# Patient Record
Sex: Male | Born: 1965 | Race: White | Hispanic: No | Marital: Married | Smoking: Current every day smoker
Health system: Southern US, Community
[De-identification: ages and names within clinical notes are randomized; demographics above are authoritative.]

## PROBLEM LIST (undated history)

## (undated) DIAGNOSIS — IMO0001 Reserved for inherently not codable concepts without codable children: Secondary | ICD-10-CM

## (undated) DIAGNOSIS — F1020 Alcohol dependence, uncomplicated: Secondary | ICD-10-CM

## (undated) DIAGNOSIS — F419 Anxiety disorder, unspecified: Secondary | ICD-10-CM

## (undated) HISTORY — PX: BACK SURGERY: SHX140

---

## 2000-08-25 ENCOUNTER — Encounter: Payer: Self-pay | Admitting: Neurosurgery

## 2000-08-25 ENCOUNTER — Ambulatory Visit (HOSPITAL_COMMUNITY): Admission: RE | Admit: 2000-08-25 | Discharge: 2000-08-25 | Payer: Self-pay | Admitting: Neurosurgery

## 2000-09-13 ENCOUNTER — Ambulatory Visit (HOSPITAL_COMMUNITY): Admission: RE | Admit: 2000-09-13 | Discharge: 2000-09-13 | Payer: Self-pay | Admitting: Neurosurgery

## 2000-09-14 ENCOUNTER — Encounter: Payer: Self-pay | Admitting: Neurosurgery

## 2000-10-09 ENCOUNTER — Encounter: Payer: Self-pay | Admitting: Neurosurgery

## 2000-10-09 ENCOUNTER — Ambulatory Visit (HOSPITAL_COMMUNITY): Admission: RE | Admit: 2000-10-09 | Discharge: 2000-10-10 | Payer: Self-pay | Admitting: Neurosurgery

## 2000-12-10 ENCOUNTER — Encounter: Payer: Self-pay | Admitting: Neurosurgery

## 2000-12-10 ENCOUNTER — Ambulatory Visit (HOSPITAL_COMMUNITY): Admission: RE | Admit: 2000-12-10 | Discharge: 2000-12-10 | Payer: Self-pay | Admitting: Neurosurgery

## 2006-04-17 ENCOUNTER — Emergency Department: Payer: Self-pay | Admitting: Emergency Medicine

## 2007-01-02 ENCOUNTER — Ambulatory Visit: Payer: Self-pay | Admitting: Neurosurgery

## 2007-01-21 ENCOUNTER — Ambulatory Visit: Payer: Self-pay | Admitting: Family Medicine

## 2007-03-14 ENCOUNTER — Ambulatory Visit: Payer: Self-pay | Admitting: Pain Medicine

## 2007-03-20 ENCOUNTER — Ambulatory Visit: Payer: Self-pay | Admitting: Pain Medicine

## 2007-04-18 ENCOUNTER — Ambulatory Visit: Payer: Self-pay | Admitting: Pain Medicine

## 2007-05-20 ENCOUNTER — Ambulatory Visit: Payer: Self-pay | Admitting: Pain Medicine

## 2007-07-25 ENCOUNTER — Ambulatory Visit: Payer: Self-pay | Admitting: Family Medicine

## 2018-05-21 ENCOUNTER — Other Ambulatory Visit
Admission: RE | Admit: 2018-05-21 | Discharge: 2018-05-21 | Disposition: A | Discharge status: Home / Self Care | Payer: Medicaid Other | Source: Other Acute Inpatient Hospital | Attending: Neurology | Admitting: Neurology

## 2018-05-21 DIAGNOSIS — R413 Other amnesia: Secondary | ICD-10-CM | POA: Insufficient documentation

## 2018-05-21 LAB — AMMONIA: Ammonia: 32 umol/L (ref 9–35)

## 2018-05-27 ENCOUNTER — Other Ambulatory Visit: Payer: Self-pay | Admitting: Neurology

## 2018-05-27 DIAGNOSIS — R413 Other amnesia: Secondary | ICD-10-CM

## 2018-06-08 ENCOUNTER — Ambulatory Visit: Payer: Medicaid Other

## 2018-06-12 ENCOUNTER — Ambulatory Visit
Admission: RE | Admit: 2018-06-12 | Discharge: 2018-06-12 | Disposition: A | Discharge status: Home / Self Care | Payer: Medicaid Other | Source: Ambulatory Visit | Attending: Neurology | Admitting: Neurology

## 2018-06-12 DIAGNOSIS — R410 Disorientation, unspecified: Secondary | ICD-10-CM | POA: Insufficient documentation

## 2018-06-12 DIAGNOSIS — G319 Degenerative disease of nervous system, unspecified: Secondary | ICD-10-CM | POA: Insufficient documentation

## 2018-06-12 DIAGNOSIS — R413 Other amnesia: Secondary | ICD-10-CM | POA: Diagnosis not present

## 2018-06-12 DIAGNOSIS — J323 Chronic sphenoidal sinusitis: Secondary | ICD-10-CM | POA: Diagnosis not present

## 2018-10-05 ENCOUNTER — Other Ambulatory Visit: Payer: Self-pay

## 2018-10-05 ENCOUNTER — Emergency Department: Payer: Medicaid Other

## 2018-10-05 ENCOUNTER — Inpatient Hospital Stay
Admission: EM | Admit: 2018-10-05 | Discharge: 2018-10-09 | DRG: 896 | Disposition: A | Discharge status: Home / Self Care | Payer: Medicaid Other | Attending: Internal Medicine | Admitting: Internal Medicine

## 2018-10-05 DIAGNOSIS — R269 Unspecified abnormalities of gait and mobility: Secondary | ICD-10-CM | POA: Diagnosis present

## 2018-10-05 DIAGNOSIS — G9341 Metabolic encephalopathy: Secondary | ICD-10-CM | POA: Diagnosis present

## 2018-10-05 DIAGNOSIS — F13239 Sedative, hypnotic or anxiolytic dependence with withdrawal, unspecified: Secondary | ICD-10-CM | POA: Diagnosis present

## 2018-10-05 DIAGNOSIS — F1027 Alcohol dependence with alcohol-induced persisting dementia: Principal | ICD-10-CM | POA: Diagnosis present

## 2018-10-05 DIAGNOSIS — R296 Repeated falls: Secondary | ICD-10-CM | POA: Diagnosis present

## 2018-10-05 DIAGNOSIS — S7001XA Contusion of right hip, initial encounter: Secondary | ICD-10-CM | POA: Diagnosis present

## 2018-10-05 DIAGNOSIS — E860 Dehydration: Secondary | ICD-10-CM | POA: Diagnosis present

## 2018-10-05 DIAGNOSIS — E876 Hypokalemia: Secondary | ICD-10-CM | POA: Diagnosis present

## 2018-10-05 DIAGNOSIS — M6282 Rhabdomyolysis: Secondary | ICD-10-CM | POA: Diagnosis present

## 2018-10-05 DIAGNOSIS — R410 Disorientation, unspecified: Secondary | ICD-10-CM

## 2018-10-05 DIAGNOSIS — F10239 Alcohol dependence with withdrawal, unspecified: Secondary | ICD-10-CM | POA: Diagnosis present

## 2018-10-05 DIAGNOSIS — W19XXXA Unspecified fall, initial encounter: Secondary | ICD-10-CM | POA: Diagnosis present

## 2018-10-05 DIAGNOSIS — R4689 Other symptoms and signs involving appearance and behavior: Secondary | ICD-10-CM | POA: Diagnosis present

## 2018-10-05 LAB — ETHANOL

## 2018-10-05 LAB — LACTIC ACID, PLASMA
Lactic Acid, Venous: 2.1 mmol/L (ref 0.5–1.9)
Lactic Acid, Venous: 1.8 mmol/L (ref 0.5–1.9)

## 2018-10-05 LAB — URINALYSIS, COMPLETE (UACMP) WITH MICROSCOPIC
GLUCOSE, UA: NEGATIVE mg/dL
HGB URINE DIPSTICK: NEGATIVE
Ketones, ur: 80 mg/dL — AB
LEUKOCYTES UA: NEGATIVE
NITRITE: NEGATIVE
PROTEIN: 30 mg/dL — AB
Specific Gravity, Urine: 1.026 (ref 1.005–1.030)
pH: 6 (ref 5.0–8.0)

## 2018-10-05 LAB — CK: Total CK: 1631 U/L — ABNORMAL HIGH (ref 49–397)

## 2018-10-05 LAB — URINE DRUG SCREEN, QUALITATIVE (ARMC ONLY)
Amphetamines, Ur Screen: NOT DETECTED
Barbiturates, Ur Screen: NOT DETECTED
Benzodiazepine, Ur Scrn: POSITIVE — AB
COCAINE METABOLITE, UR ~~LOC~~: NOT DETECTED
Cannabinoid 50 Ng, Ur ~~LOC~~: NOT DETECTED
MDMA (Ecstasy)Ur Screen: NOT DETECTED
METHADONE SCREEN, URINE: NOT DETECTED
OPIATE, UR SCREEN: NOT DETECTED
PHENCYCLIDINE (PCP) UR S: NOT DETECTED
Tricyclic, Ur Screen: POSITIVE — AB

## 2018-10-05 LAB — ACETAMINOPHEN LEVEL: Acetaminophen (Tylenol), Serum: <10 ug/mL — ABNORMAL LOW (ref 10–30)

## 2018-10-05 LAB — CBC WITH DIFFERENTIAL/PLATELET
Abs Immature Granulocytes: 0.03 10*3/uL (ref 0.00–0.07)
BASOS PCT: 0 %
Basophils Absolute: 0 10*3/uL (ref 0.0–0.1)
EOS ABS: 0 10*3/uL (ref 0.0–0.5)
EOS PCT: 0 %
HEMATOCRIT: 40.7 % (ref 39.0–52.0)
Hemoglobin: 13.9 g/dL (ref 13.0–17.0)
Immature Granulocytes: 0 %
Lymphocytes Relative: 14 %
Lymphs Abs: 1.1 10*3/uL (ref 0.7–4.0)
MCH: 38.7 pg — ABNORMAL HIGH (ref 26.0–34.0)
MCHC: 34.2 g/dL (ref 30.0–36.0)
MCV: 113.4 fL — AB (ref 80.0–100.0)
MONO ABS: 0.9 10*3/uL (ref 0.1–1.0)
Monocytes Relative: 12 %
NRBC: 0 % (ref 0.0–0.2)
Neutro Abs: 5.7 10*3/uL (ref 1.7–7.7)
Neutrophils Relative %: 74 %
PLATELETS: 162 10*3/uL (ref 150–400)
RBC: 3.59 MIL/uL — AB (ref 4.22–5.81)
RDW: 12.5 % (ref 11.5–15.5)
WBC: 7.8 10*3/uL (ref 4.0–10.5)

## 2018-10-05 LAB — SALICYLATE LEVEL

## 2018-10-05 LAB — COMPREHENSIVE METABOLIC PANEL
ALT: 34 U/L (ref 0–44)
ANION GAP: 17 — AB (ref 5–15)
AST: 111 U/L — ABNORMAL HIGH (ref 15–41)
Albumin: 3.1 g/dL — ABNORMAL LOW (ref 3.5–5.0)
Alkaline Phosphatase: 37 U/L — ABNORMAL LOW (ref 38–126)
BILIRUBIN TOTAL: 4.1 mg/dL — AB (ref 0.3–1.2)
BUN: 8 mg/dL (ref 6–20)
CO2: 25 mmol/L (ref 22–32)
Calcium: 9 mg/dL (ref 8.9–10.3)
Chloride: 95 mmol/L — ABNORMAL LOW (ref 98–111)
Creatinine, Ser: 0.53 mg/dL — ABNORMAL LOW (ref 0.61–1.24)
GFR calc Af Amer: >60 mL/min (ref >60)
Glucose, Bld: 109 mg/dL — ABNORMAL HIGH (ref 70–99)
POTASSIUM: 3.8 mmol/L (ref 3.5–5.1)
Sodium: 137 mmol/L (ref 135–145)
TOTAL PROTEIN: 7.5 g/dL (ref 6.5–8.1)

## 2018-10-05 LAB — TROPONIN I

## 2018-10-05 LAB — AMMONIA: AMMONIA: 23 umol/L (ref 9–35)

## 2018-10-05 LAB — PROTIME-INR
INR: 1.39
Prothrombin Time: 16.9 seconds — ABNORMAL HIGH (ref 11.4–15.2)

## 2018-10-05 LAB — GLUCOSE, CAPILLARY: Glucose-Capillary: 107 mg/dL — ABNORMAL HIGH (ref 70–99)

## 2018-10-05 LAB — TSH: TSH: 1.491 u[IU]/mL (ref 0.350–4.500)

## 2018-10-05 LAB — LIPASE, BLOOD: Lipase: 27 U/L (ref 11–51)

## 2018-10-05 LAB — MAGNESIUM: Magnesium: 1.7 mg/dL (ref 1.7–2.4)

## 2018-10-05 MED ORDER — THIAMINE HCL 100 MG/ML IJ SOLN
100.0000 mg | Freq: Once | INTRAMUSCULAR | Status: AC
  Administered 2018-10-05: 100 mg via INTRAVENOUS
  Filled 2018-10-05: qty 2

## 2018-10-05 MED ORDER — NALOXONE NEWBORN-WH INJECTION 0.4 MG/ML
0.4000 mg | Freq: Once | INTRAMUSCULAR | Status: DC
  Filled 2018-10-05: qty 1

## 2018-10-05 MED ORDER — LORAZEPAM 1 MG PO TABS
1.0000 mg | ORAL_TABLET | Freq: Four times a day (QID) | ORAL | Status: AC | PRN
  Administered 2018-10-07: 14:00:00 1 mg via ORAL
  Filled 2018-10-05: qty 1

## 2018-10-05 MED ORDER — SODIUM CHLORIDE 0.9 % IV BOLUS
500.0000 mL | Freq: Once | INTRAVENOUS | Status: AC
  Administered 2018-10-05: 12:00:00 via INTRAVENOUS

## 2018-10-05 MED ORDER — PNEUMOCOCCAL VAC POLYVALENT 25 MCG/0.5ML IJ INJ: 0.5000 mL | INJECTION | INTRAMUSCULAR | Status: DC

## 2018-10-05 MED ORDER — ACETAMINOPHEN 325 MG PO TABS
650.0000 mg | ORAL_TABLET | Freq: Four times a day (QID) | ORAL | Status: DC | PRN
  Administered 2018-10-08: 13:00:00 650 mg via ORAL
  Filled 2018-10-05: qty 2

## 2018-10-05 MED ORDER — ORAL CARE MOUTH RINSE
15.0000 mL | Freq: Two times a day (BID) | OROMUCOSAL | Status: DC
  Administered 2018-10-06 – 2018-10-08 (×4): 15 mL via OROMUCOSAL

## 2018-10-05 MED ORDER — ONDANSETRON HCL 4 MG PO TABS: 4.0000 mg | ORAL_TABLET | Freq: Four times a day (QID) | ORAL | Status: DC | PRN

## 2018-10-05 MED ORDER — ONDANSETRON HCL 4 MG/2ML IJ SOLN: 4.0000 mg | Freq: Four times a day (QID) | INTRAMUSCULAR | Status: DC | PRN

## 2018-10-05 MED ORDER — POLYETHYLENE GLYCOL 3350 17 G PO PACK: 17.0000 g | PACK | Freq: Every day | ORAL | Status: DC | PRN

## 2018-10-05 MED ORDER — FOLIC ACID 1 MG PO TABS
1.0000 mg | ORAL_TABLET | Freq: Every day | ORAL | Status: DC
  Administered 2018-10-05 – 2018-10-09 (×5): 1 mg via ORAL
  Filled 2018-10-05 (×5): qty 1

## 2018-10-05 MED ORDER — SODIUM CHLORIDE 0.9 % IV SOLN
INTRAVENOUS | Status: DC
  Administered 2018-10-05 – 2018-10-07 (×6): via INTRAVENOUS

## 2018-10-05 MED ORDER — LACTULOSE 10 GM/15ML PO SOLN
20.0000 g | Freq: Two times a day (BID) | ORAL | Status: DC
  Administered 2018-10-05 – 2018-10-07 (×5): 20 g via ORAL
  Filled 2018-10-05 (×5): qty 30

## 2018-10-05 MED ORDER — THIAMINE HCL 100 MG/ML IJ SOLN
100.0000 mg | Freq: Every day | INTRAMUSCULAR | Status: DC
  Filled 2018-10-05 (×5): qty 1

## 2018-10-05 MED ORDER — ACETAMINOPHEN 650 MG RE SUPP: 650.0000 mg | Freq: Four times a day (QID) | RECTAL | Status: DC | PRN

## 2018-10-05 MED ORDER — ALPRAZOLAM 1 MG PO TABS
1.0000 mg | ORAL_TABLET | Freq: Three times a day (TID) | ORAL | Status: DC
  Administered 2018-10-05 – 2018-10-07 (×6): 1 mg via ORAL
  Filled 2018-10-05 (×6): qty 1

## 2018-10-05 MED ORDER — ENOXAPARIN SODIUM 40 MG/0.4ML ~~LOC~~ SOLN
40.0000 mg | SUBCUTANEOUS | Status: DC
  Administered 2018-10-05: 14:00:00 40 mg via SUBCUTANEOUS
  Filled 2018-10-05: qty 0.4

## 2018-10-05 MED ORDER — SODIUM CHLORIDE 0.9 % IV SOLN
1.0000 mg | Freq: Once | INTRAVENOUS | Status: AC
  Administered 2018-10-05: 1 mg via INTRAVENOUS
  Filled 2018-10-05: qty 0.2

## 2018-10-05 MED ORDER — LORAZEPAM 2 MG/ML IJ SOLN
1.0000 mg | Freq: Four times a day (QID) | INTRAMUSCULAR | Status: AC | PRN
  Administered 2018-10-05 – 2018-10-08 (×4): 1 mg via INTRAVENOUS
  Filled 2018-10-05 (×4): qty 1

## 2018-10-05 MED ORDER — LORAZEPAM 2 MG/ML IJ SOLN
2.0000 mg | Freq: Once | INTRAMUSCULAR | Status: AC
  Administered 2018-10-05: 1 mg via INTRAVENOUS
  Filled 2018-10-05: qty 1

## 2018-10-05 MED ORDER — INFLUENZA VAC SPLIT QUAD 0.5 ML IM SUSY: 0.5000 mL | PREFILLED_SYRINGE | INTRAMUSCULAR | Status: DC

## 2018-10-05 MED ORDER — ADULT MULTIVITAMIN W/MINERALS CH
1.0000 | ORAL_TABLET | Freq: Every day | ORAL | Status: DC
  Administered 2018-10-05 – 2018-10-09 (×5): 1 via ORAL
  Filled 2018-10-05 (×5): qty 1

## 2018-10-05 MED ORDER — VITAMIN B-1 100 MG PO TABS
100.0000 mg | ORAL_TABLET | Freq: Every day | ORAL | Status: DC
  Administered 2018-10-05 – 2018-10-09 (×5): 100 mg via ORAL
  Filled 2018-10-05 (×5): qty 1

## 2018-10-05 NOTE — ED Triage Notes (Signed)
Pt presents from home via acems with c/o frequent falls and altered mental status. EMS was called to pt residence yesterday night with c/o fall, but refused tx. EMS was called back to residence by pt's family with c/o continued falls and confusion. Pt oriented to self, but disoriented to time. Pt presents to ER covered in urine and fecal matter. Varying stages of bruising noted to body.

## 2018-10-05 NOTE — ED Notes (Signed)
Date and time results received: 10/05/18 12:19 PM    Test: Lactic Acid Critical Value: 2.1  Name of Provider Notified: Dr. Alphonzo LemmingsMcShane  Orders Received? Or Actions Taken?: Orders Received - See Orders for details

## 2018-10-05 NOTE — ED Notes (Signed)
Pt family member requested to this RN that pt's clothes be thrown away.

## 2018-10-05 NOTE — H&P (Signed)
[facility] [place] [facility]   PATIENT NAME: Phillip Juarez    MR#:  [id number]  DATE OF BIRTH:  12-06-1965  DATE OF ADMISSION:  10/05/2018  PRIMARY CARE PHYSICIAN: [facility]   REQUESTING/REFERRING PHYSICIAN:dr mcshane  CHIEF COMPLAINT:   confusion and falls HISTORY OF PRESENT ILLNESS:  Phillip Juarez  is a 52 y.o. male with a known history of EtOh dependence who presents to the ER via EMS due to falls and increased confusion. He is being followed by Dr. 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  Family was in the room and now has left.  HPI taken from nurse and ER physician.  Apparently the family members went to check on him yesterday and he was slightly confused and they were concerned so they called EMS.  At that time patient was more awake and alert and refused EMS transport.  Today when they checked on him he was very confused and disheveled and was brought to the ER for further evaluation.  In the emergency room he remains in a confused state.  Family members are also concerned due to recurrent falls.  Patient has bruising on the right hip and arm.  He is able to move all extremities.  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 rate 18, height 6' (1.829 m), weight 77.1 kg, SpO2 97 %.  PHYSICAL EXAMINATION:   Physical Exam  Constitutional: No distress.  dissheveled  HENT:  Head: Normocephalic.  Eyes: Pupils are equal, round, and reactive to light. Right eye exhibits no  discharge. Left eye exhibits no discharge. No scleral icterus.  Right eye is shut  Neck: Neck supple. No JVD present. No tracheal deviation present. No thyromegaly present.  Cardiovascular: Normal rate, regular rhythm and normal heart sounds. Exam reveals no gallop and no friction rub.  No murmur heard. Pulmonary/Chest: Effort normal and breath sounds normal. No respiratory distress. He has no wheezes. He has no rales. He exhibits no tenderness.  Abdominal: Soft. Bowel sounds are normal. He exhibits no distension and no mass. There is no tenderness. There is no rebound and no guarding.  Musculoskeletal: Normal range of motion. He exhibits no edema.  Neurological: He displays normal reflexes. No cranial nerve deficit or sensory deficit. He exhibits normal muscle tone. Coordination normal.  Somnolent and confused Without focal deficits  Skin: Skin is warm. No rash noted. No erythema.   Bruising noted on right hip/buttock and right arm  Psychiatric: Judgment normal.      LABORATORY PANEL:   CBC Recent Labs  Lab 10/05/18 1113  WBC 7.8  HGB 13.9  HCT 40.7  PLT 162   ------------------------------------------------------------------------------------------------------------------  Chemistries  Recent Labs  Lab 10/05/18 1113  NA 137  K 3.8  CL 95*  CO2 25  GLUCOSE 109*  BUN 8  CREATININE 0.53*  CALCIUM 9.0  MG 1.7  AST 111*  ALT 34  ALKPHOS 37*  BILITOT 4.1*   ------------------------------------------------------------------------------------------------------------------  Cardiac Enzymes Recent Labs  Lab 10/05/18 1113  TROPONINI <0.03   ------------------------------------------------------------------------------------------------------------------  RADIOLOGY:  Ct Head Wo Contrast  Result Date: 10/05/2018 CLINICAL DATA:  Frequent falls, altered mental status, disorientation, confusion, bruising at scattered sites EXAM: CT HEAD WITHOUT CONTRAST TECHNIQUE:  Contiguous axial images were obtained from the base of the skull through the vertex without intravenous contrast. Sagittal and coronal MPR images reconstructed from axial data set. COMPARISON:  None; correlation MR brain 06/12/2018 FINDINGS: Brain: Mild generalized atrophy. Normal ventricular morphology. No midline shift or mass effect. Otherwise normal appearance of brain parenchyma. No intracranial hemorrhage, mass lesion, or acute infarction. No extra-axial fluid collections. Vascular: No hyperdense vessels Skull: Intact with note of a stable small lucent focus at the posterior RIGHT parietal bone Sinuses/Orbits: Minimal mucosal thickening in ethmoid air cells. Remaining sinuses clear. Other: N/A IMPRESSION: Mild generalized atrophy. No acute intracranial abnormalities. Electronically Signed   By: Ulyses SouthwardMark  Boles M.D.   On: 10/05/2018 11:39   Dg Chest Port 1 View  Result Date: 10/05/2018 CLINICAL DATA:  Confusion, altered mental status, frequent falls EXAM: PORTABLE CHEST 1 VIEW COMPARISON:  Portable exam 1115 hours compared to CT chest of 07/25/2007 FINDINGS: Normal heart size, mediastinal contours, and pulmonary vascularity. Lungs clear. No pleural effusion or pneumothorax. Bones demineralized. IMPRESSION: No acute abnormalities. Electronically Signed   By: Ulyses SouthwardMark  Boles M.D.   On: 10/05/2018 11:41    EKG:  Sinus rhythm heart rate 80 no ST elevation or depression  IMPRESSION AND PLAN:   52 y/o male on XANAZ 2 mg QID and etoh dependence with etoh related dementia who presents to the ED due to increased confuion.  1. Acute encephalopathy with concern for impending DT's or xanax abuse with underlying alcohol-related dementia  Check TSH, B1, B12, RPR and Nh3 level and urine tox screen  Check EEG r/o seizure  Neuro checks q 4 h for at least 24 hours No signs of acute infection on labs CT head negative for acute issues and patient not demenostrating focal defecits. One time dose narcan ordered and  lactulose until ammonia level back  2. EtOh abuse with possible DTs: CIWA protocol    All the records are reviewed and case discussed with ED provider.  CODE STATUS: FULL  TOTAL TIME TAKING CARE OF THIS PATIENT: 44 minutes.    Phillip Juarez M.D on 10/05/2018 at 12:13 PM  Between 7am to 6pm - Pager - [phone]  After 6pm go to [email or web address] - Social research officer, governmentpassword [email or web address]  [facility]  Office  [phone]  CC: Primary care physician; [facility]

## 2018-10-05 NOTE — ED Notes (Signed)
Pt noted to be mumbling incoherently in bed. Pt family at bedside at this time.

## 2018-10-05 NOTE — ED Provider Notes (Signed)
[facility] Emergency Department Provider Note  ____________________________________________   I have reviewed the triage vital signs and the nursing notes. Where available I have reviewed prior notes and, if possible and indicated, outside hospital notes.    HISTORY  Chief Complaint Fall and Altered Mental Status    HPI Phillip Juarez is a 52 y.o. male  Who presents today complaining of feeling confused.  According to family, he drinks alcohol significantly every day, they do not know when the last time he drank alcohol was, also they state that he takes Xanax 3 times a day has been for years.  They are not sure if he is taking it.  They found him yesterday after not seeing him since Wednesday, in a state of dishevelment and confusion and they called 911 however at that time he was oriented and able to refuse transport.  Family were concerned and they came back today to check on him and found him even more confused and brought him in.  Family member with him is power of attorney.  Patient himself is awake and will answer basic questions but will not give further history.  Level 5 chart caveat; no further history available due to patient status.    No past medical history on file.  There are no active problems to display for this patient.     Prior to Admission medications   Not on File    Allergies Patient has no known allergies.  No family history on file.  Social History Social History   Tobacco Use  . Smoking status: Not on file  Substance Use Topics  . Alcohol use: Not on file  . Drug use: Not on file    Review of Systems Level 5 chart caveat; no further history available due to patient status.   ____________________________________________   PHYSICAL EXAM:  VITAL SIGNS: ED Triage Vitals  Enc Vitals Group     BP 10/05/18 1127 128/86     Pulse Rate 10/05/18 1127 95     Resp 10/05/18 1127 (!) 22     Temp 10/05/18 1127 (!) 97.5  F (36.4 C)     Temp Source 10/05/18 1127 Axillary     SpO2 10/05/18 1127 100 %     Weight 10/05/18 1108 170 lb (77.1 kg)     Height 10/05/18 1108 6' (1.829 m)     Head Circumference --      Peak Flow --      Pain Score --      Pain Loc --      Pain Edu? --      Excl. in GC? --     Constitutional: he is alert and confused, very early kempt with stool on his leg, guarding his airway will tell me his name, will answer some simple questions Eyes: Conjunctivae are normal Head: Atraumatic no obvious injury noted HEENT: No congestion/rhinnorhea. Mucous membranes are dry.  Oropharynx non-erythematous Neck:   Nontender with no meningismus, no masses, no stridor Cardiovascular: Patient with mild tachycardia 108. Grossly normal heart sounds.  Good peripheral circulation. Respiratory: Normal respiratory effort.  No retractions. Lungs CTAB. Abdominal: Soft and nontender. No distention. No guarding no rebound Back:  There is no focal tenderness or step off.  there is no midline tenderness there are no lesions noted. there is no CVA tenderness GU: No obvious lesions Musculoskeletal: No lower extremity tenderness, no upper extremity tenderness. No joint effusions, no DVT signs strong distal pulses no edema  Neurologic:  Normal speech and language. No gross focal neurologic deficits are appreciated.  Skin:  Skin is warm, dry and intact.  Good bruising noted to the right forearm, good distal pulses are soft Psychiatric: Mood and affect are normal. Speech and behavior are normal.  ____________________________________________   LABS (all labs ordered are listed, but only abnormal results are displayed)  Labs Reviewed  CBC WITH DIFFERENTIAL/PLATELET - Abnormal; Notable for the following components:      Result Value   RBC 3.59 (*)    MCV 113.4 (*)    MCH 38.7 (*)    All other components within normal limits  COMPREHENSIVE METABOLIC PANEL - Abnormal; Notable for the following components:    Chloride 95 (*)    Glucose, Bld 109 (*)    Creatinine, Ser 0.53 (*)    Albumin 3.1 (*)    AST 111 (*)    Alkaline Phosphatase 37 (*)    Total Bilirubin 4.1 (*)    Anion gap 17 (*)    All other components within normal limits  ACETAMINOPHEN LEVEL - Abnormal; Notable for the following components:   Acetaminophen (Tylenol), Serum <10 (*)    All other components within normal limits  LACTIC ACID, PLASMA - Abnormal; Notable for the following components:   Lactic Acid, Venous 2.1 (*)    All other components within normal limits  URINALYSIS, COMPLETE (UACMP) WITH MICROSCOPIC - Abnormal; Notable for the following components:   Color, Urine AMBER (*)    APPearance CLEAR (*)    Bilirubin Urine SMALL (*)    Ketones, ur 80 (*)    Protein, ur 30 (*)    Bacteria, UA RARE (*)    All other components within normal limits  GLUCOSE, CAPILLARY - Abnormal; Notable for the following components:   Glucose-Capillary 107 (*)    All other components within normal limits  URINE CULTURE  ETHANOL  SALICYLATE LEVEL  TROPONIN I  MAGNESIUM  LIPASE, BLOOD  LACTIC ACID, PLASMA  URINE DRUG SCREEN, QUALITATIVE ([facility] ONLY)  PROTIME-INR  AMMONIA  CK    Pertinent labs  results that were available during my care of the patient were reviewed by me and considered in my medical decision making (see chart for details). ____________________________________________  EKG  I personally interpreted any EKGs ordered by me or triage Sinus rhythm rate 80 bpm no acute ST elevation or depression normal axis unremarkable EKG ____________________________________________  RADIOLOGY  Pertinent labs & imaging results that were available during my care of the patient were reviewed by me and considered in my medical decision making (see chart for details). If possible, patient and/or family made aware of any abnormal findings.  Ct Head Wo Contrast  Result Date: 10/05/2018 CLINICAL DATA:  Frequent falls, altered mental  status, disorientation, confusion, bruising at scattered sites EXAM: CT HEAD WITHOUT CONTRAST TECHNIQUE: Contiguous axial images were obtained from the base of the skull through the vertex without intravenous contrast. Sagittal and coronal MPR images reconstructed from axial data set. COMPARISON:  None; correlation MR brain 06/12/2018 FINDINGS: Brain: Mild generalized atrophy. Normal ventricular morphology. No midline shift or mass effect. Otherwise normal appearance of brain parenchyma. No intracranial hemorrhage, mass lesion, or acute infarction. No extra-axial fluid collections. Vascular: No hyperdense vessels Skull: Intact with note of a stable small lucent focus at the posterior RIGHT parietal bone Sinuses/Orbits: Minimal mucosal thickening in ethmoid air cells. Remaining sinuses clear. Other: N/A IMPRESSION: Mild generalized atrophy. No acute intracranial abnormalities. Electronically Signed   By:  Ulyses Southward M.D.   On: 10/05/2018 11:39   Dg Chest Port 1 View  Result Date: 10/05/2018 CLINICAL DATA:  Confusion, altered mental status, frequent falls EXAM: PORTABLE CHEST 1 VIEW COMPARISON:  Portable exam 1115 hours compared to CT chest of 07/25/2007 FINDINGS: Normal heart size, mediastinal contours, and pulmonary vascularity. Lungs clear. No pleural effusion or pneumothorax. Bones demineralized. IMPRESSION: No acute abnormalities. Electronically Signed   By: Ulyses Southward M.D.   On: 10/05/2018 11:41   ____________________________________________    PROCEDURES  Procedure(s) performed: None  Procedures  Critical Care performed: CRITICAL CARE Performed by: Jeanmarie Plant   Total critical care time: 38 minutes  Critical care time was exclusive of separately billable procedures and treating other patients.  Critical care was necessary to treat or prevent imminent or life-threatening deterioration.  Critical care was time spent personally by me on the following activities: development of  treatment plan with patient and/or surrogate as well as nursing, discussions with consultants, evaluation of patient's response to treatment, examination of patient, obtaining history from patient or surrogate, ordering and performing treatments and interventions, ordering and review of laboratory studies, ordering and review of radiographic studies, pulse oximetry and re-evaluation of patient's condition.   ____________________________________________   INITIAL IMPRESSION / ASSESSMENT AND PLAN / ED COURSE  Pertinent labs & imaging results that were available during my care of the patient were reviewed by me and considered in my medical decision making (see chart for details).  Here with confusion and evidence of falls at home.  Unclear exact etiology.  Blood work shows an elevated bilirubin which certainly could be because of alcohol.  He does not have evidence on exam of obstructive pathology physically that is to say he is nontender in his abdomen to the extent that we can determine.  Ammonia level is pending, EtOH is negative, patient does drink every day, also takes significant  benzos.  The family thinks that he is "out of them".  This picture certainly could be the result of DTs or withdrawal.  Is unclear when the last time he had alcohol was.  EtOH level is negative.  He is a daily drinker.  We are giving him Ativan to forestall seizures, IV fluids, magnesium is reassuring we are giving him thiamine and folate, blood work is noted.  CT head is negative, will x-ray his forearm but there is no evidence at this time of fracture.  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  We will admit to the hospitalist service.  Did discuss with Dr. Tildon Husky who agrees with plan    ____________________________________________   FINAL CLINICAL IMPRESSION(S) / ED DIAGNOSES  Final diagnoses:  Confusion       This chart was dictated using voice recognition software.  Despite best efforts to proofread,  errors can occur which can change meaning.      Jeanmarie Plant, MD 10/05/18 1215

## 2018-10-06 LAB — VITAMIN B12: Vitamin B-12: 465 pg/mL (ref 180–914)

## 2018-10-06 LAB — URINE CULTURE: Culture: NO GROWTH

## 2018-10-06 NOTE — Progress Notes (Signed)
Advanced care plan.  Purpose of the Encounter: CODE STATUS  Parties in Attendance: Patient and family  Patient's Decision Capacity: Not good  Subjective/Patient's story: Presented to the emergency room for confusion and frequent falls   Objective/Medical story Patient has  alcohol abuse and also has alcoholic dementia Has frequent falls and encephalopathic Patient's sister was available at bedside Needs IV fluids Management of multiple bruises secondary to fall   Goals of care determination:  Advance care directives and goals of care discussed with patient's family in detail For now they want everything done which includes CPR, intubation ventilator if the need arises   CODE STATUS: Full code   Time spent discussing advanced care planning: 16 minutes

## 2018-10-06 NOTE — Plan of Care (Signed)

## 2018-10-06 NOTE — Progress Notes (Signed)
Pt VS stable. Alert and oriented to self at this time.  Currently resting without agitation. Pt states his last drink was "last night" 10/04/2018. Pt states he has been drinking for over 10 years and requests alcohol at this time but is easily reoriented and distracted. Will continue to monitor.

## 2018-10-06 NOTE — Progress Notes (Addendum)
[facility] - [place] at [facility]   PATIENT NAME: Phillip Juarez    MR#:  [id number]  DATE OF BIRTH:  08/27/1966  SUBJECTIVE:  CHIEF COMPLAINT:   Chief Complaint  Patient presents with  . Fall  . Altered Mental Status  Patient seen and evaluated today Unable to give any history Lethargic and sleepy Patient was worked up with CT head which showed no acute abnormality Imaging studies reveal no fracture REVIEW OF SYSTEMS:    ROS  Unable to obtain secondary to confusion  DRUG ALLERGIES:  No Known Allergies  VITALS:  Blood pressure 108/70, pulse 87, temperature 98.2 F (36.8 C), temperature source Oral, resp. rate 16, height 5\' 7"  (1.702 m), weight 79.8 kg, SpO2 96 %.  PHYSICAL EXAMINATION:   Physical Exam  GENERAL:  52 y.o.-year-old patient lying in the bed  EYES: Pupils equal, round, reactive to light and accommodation. No scleral icterus. Extraocular muscles intact.  HEENT: Head atraumatic, normocephalic. Oropharynx dry and nasopharynx clear.  NECK:  Supple, no jugular venous distention. No thyroid enlargement, no tenderness.  LUNGS: Normal breath sounds bilaterally, no wheezing, rales, rhonchi. No use of accessory muscles of respiration.  CARDIOVASCULAR: S1, S2 normal. No murmurs, rubs, or gallops.  ABDOMEN: Soft, nontender, nondistended. Bowel sounds present. No organomegaly or mass.  EXTREMITIES: No clubbing or edema b/l.  Has ecchymosis over the right arm forearm and right lower extremity NEUROLOGIC: Unable to assess completely Patient confused lethargic and sleepy PSYCHIATRIC: The patient is alert and oriented x none SKIN: Multiple bruises over the right arm forearm and right thigh  LABORATORY PANEL:   CBC Recent Labs  Lab 10/05/18 1113  WBC 7.8  HGB 13.9  HCT 40.7  PLT 162   ------------------------------------------------------------------------------------------------------------------ Chemistries  Recent Labs  Lab 10/05/18 1113   NA 137  K 3.8  CL 95*  CO2 25  GLUCOSE 109*  BUN 8  CREATININE 0.53*  CALCIUM 9.0  MG 1.7  AST 111*  ALT 34  ALKPHOS 37*  BILITOT 4.1*   ------------------------------------------------------------------------------------------------------------------  Cardiac Enzymes Recent Labs  Lab 10/05/18 1113  TROPONINI <0.03   ------------------------------------------------------------------------------------------------------------------  RADIOLOGY:  Dg Forearm Right  Result Date: 10/05/2018 CLINICAL DATA:  RIGHT forearm bruising EXAM: RIGHT FOREARM - 2 VIEW COMPARISON:  None FINDINGS: Osseous mineralization normal. Wrist and elbow joint alignments normal. No acute fracture, dislocation, or bone destruction. Soft tissue swelling of RIGHT forearm and elbow. IMPRESSION: No acute osseous abnormalities. Diffuse soft tissue swelling. Electronically Signed   By: Ulyses Southward M.D.   On: 10/05/2018 12:27   Ct Head Wo Contrast  Result Date: 10/05/2018 CLINICAL DATA:  Frequent falls, altered mental status, disorientation, confusion, bruising at scattered sites EXAM: CT HEAD WITHOUT CONTRAST TECHNIQUE: Contiguous axial images were obtained from the base of the skull through the vertex without intravenous contrast. Sagittal and coronal MPR images reconstructed from axial data set. COMPARISON:  None; correlation MR brain 06/12/2018 FINDINGS: Brain: Mild generalized atrophy. Normal ventricular morphology. No midline shift or mass effect. Otherwise normal appearance of brain parenchyma. No intracranial hemorrhage, mass lesion, or acute infarction. No extra-axial fluid collections. Vascular: No hyperdense vessels Skull: Intact with note of a stable small lucent focus at the posterior RIGHT parietal bone Sinuses/Orbits: Minimal mucosal thickening in ethmoid air cells. Remaining sinuses clear. Other: N/A IMPRESSION: Mild generalized atrophy. No acute intracranial abnormalities. Electronically Signed   By:  Ulyses Southward M.D.   On: 10/05/2018 11:39   Dg Chest [facility] 1 View  Result  Date: 10/05/2018 CLINICAL DATA:  Confusion, altered mental status, frequent falls EXAM: PORTABLE CHEST 1 VIEW COMPARISON:  Portable exam 1115 hours compared to CT chest of 07/25/2007 FINDINGS: Normal heart size, mediastinal contours, and pulmonary vascularity. Lungs clear. No pleural effusion or pneumothorax. Bones demineralized. IMPRESSION: No acute abnormalities. Electronically Signed   By: Ulyses SouthwardMark  Boles M.D.   On: 10/05/2018 11:41     ASSESSMENT AND PLAN:  52 year old male patient with history of dementia, alcohol dependence and abuse currently under hospitalist service for frequent falls and confusion  -Acute metabolic encephalopathy Alcohol induced IV fluid hydration Supportive care  -Alcoholic dementia Thiamine and folic acid supplements  -Multiple ecchymosis secondary to fall Avoid blood thinner medications  -Alcohol abuse CIWA protocol  -Rhabdomyolysis Secondary to falls IV fluid hydration  -DVT prophylaxis sequential compression device to lower extremities  -Gait instability and ambulatory dysfunction Once medically stable physical therapy evaluation  All the records are reviewed and case discussed with Care Management/Social Worker. Management plans discussed with the patient, family and they are in agreement.  CODE STATUS: Full code  DVT Prophylaxis: SCDs  TOTAL TIME TAKING CARE OF THIS PATIENT: 36 minutes.   POSSIBLE D/C IN  3 to 4 DAYS, DEPENDING ON CLINICAL CONDITION.  Ihor AustinPavan Jalaila Caradonna M.D on 10/06/2018 at 10:20 AM  Between 7am to 6pm - Pager - [phone]  After 6pm go to [email or web address] - password [email or web address]  [facility]  Office  [phone]  CC: Primary care physician; Center, [facility]  Note: This dictation was prepared with Dragon dictation along with smaller phrase technology. Any transcriptional errors that result from this process are  unintentional.

## 2018-10-06 NOTE — Care Management (Signed)
RNCM team received consult for financial assistance. Patient is currently incoherent from alcohol use. Patient was brought in from ED with increasing confusion and falls. Patient currently asking for alcohol. Blood alcohol level significantly elevated. Patient has medicaid. Seen at [facility]. RNCM team will complete assessment as patient becomes more lucid.

## 2018-10-06 NOTE — Progress Notes (Signed)
Pt anxious and agitated about medication change. Dr. Imogene Burnhen notified about home Xanax prescription being 2mg  4x daily and the hospital order is 1mg  3x daily. Dr. Imogene Burnhen stated to keep the order "the same" as ordered.

## 2018-10-06 NOTE — Progress Notes (Signed)
PT Cancellation Note  Patient Details Name: Phillip Juarez MRN: [id number] DOB: 04/16/1966   Cancelled Treatment:    Reason Eval/Treat Not Completed: Patient's level of consciousness.  Pt could not be woken up and so withheld therapy at nursing's encouragement due to CIWA protocol.  Try again tomorrow.   Phillip Juarez 10/06/2018, 1:41 PM   Phillip Juarez, PT MS Acute Rehab Dept. Number: [facility] [id number] and [facility] [phone]

## 2018-10-07 ENCOUNTER — Inpatient Hospital Stay: Payer: Medicaid Other

## 2018-10-07 DIAGNOSIS — R410 Disorientation, unspecified: Secondary | ICD-10-CM

## 2018-10-07 LAB — BASIC METABOLIC PANEL
ANION GAP: 9 (ref 5–15)
BUN: 7 mg/dL (ref 6–20)
CO2: 26 mmol/L (ref 22–32)
Calcium: 7.8 mg/dL — ABNORMAL LOW (ref 8.9–10.3)
Chloride: 105 mmol/L (ref 98–111)
Creatinine, Ser: 0.33 mg/dL — ABNORMAL LOW (ref 0.61–1.24)
GFR calc Af Amer: >60 mL/min (ref >60)
GLUCOSE: 102 mg/dL — AB (ref 70–99)
POTASSIUM: 2.7 mmol/L — AB (ref 3.5–5.1)
Sodium: 140 mmol/L (ref 135–145)

## 2018-10-07 LAB — MAGNESIUM: Magnesium: 1.7 mg/dL (ref 1.7–2.4)

## 2018-10-07 LAB — RPR: RPR: NONREACTIVE

## 2018-10-07 MED ORDER — VITAMIN C 500 MG PO TABS
500.0000 mg | ORAL_TABLET | Freq: Two times a day (BID) | ORAL | Status: DC
  Administered 2018-10-07 – 2018-10-09 (×5): 500 mg via ORAL
  Filled 2018-10-07 (×5): qty 1

## 2018-10-07 MED ORDER — SODIUM CHLORIDE 0.9 % IV SOLN
INTRAVENOUS | Status: DC
  Administered 2018-10-07: 19:00:00 via INTRAVENOUS

## 2018-10-07 MED ORDER — MAGNESIUM SULFATE IN D5W 1-5 GM/100ML-% IV SOLN
1.0000 g | Freq: Once | INTRAVENOUS | Status: AC
  Administered 2018-10-07: 1 g via INTRAVENOUS
  Filled 2018-10-07: qty 100

## 2018-10-07 MED ORDER — POTASSIUM CHLORIDE 10 MEQ/100ML IV SOLN
10.0000 meq | INTRAVENOUS | Status: AC
  Administered 2018-10-07 (×4): 10 meq via INTRAVENOUS
  Filled 2018-10-07 (×2): qty 100

## 2018-10-07 MED ORDER — ENSURE ENLIVE PO LIQD
237.0000 mL | Freq: Three times a day (TID) | ORAL | Status: DC
  Administered 2018-10-07 – 2018-10-09 (×3): 237 mL via ORAL

## 2018-10-07 MED ORDER — POTASSIUM CHLORIDE CRYS ER 20 MEQ PO TBCR
40.0000 meq | EXTENDED_RELEASE_TABLET | Freq: Two times a day (BID) | ORAL | Status: AC
  Administered 2018-10-07 (×2): 40 meq via ORAL
  Filled 2018-10-07: qty 2
  Filled 2018-10-07: qty 4

## 2018-10-07 NOTE — Discharge Instructions (Signed)
Nutrition Post Hospital Stay Proper nutrition can help your body recover from illness and injury.   Foods and beverages high in protein, vitamins, and minerals help rebuild muscle loss, promote healing, & reduce fall risk.   In addition to eating healthy foods, a nutrition shake is an easy, delicious way to get the nutrition you need during and after your hospital stay  It is recommended that you continue to drink 2-3 bottles per day of:      Ensure Enlive Vitamins: complete multivitamin with minerals once daily, thiamine 100 mg once daily, folic acid 1 mg once daily, vitamin C 500 mg twice daily  Tips for adding a nutrition shake into your routine: As allowed, drink one with vitamins or medications instead of water or juice Enjoy one as a tasty mid-morning or afternoon snack Drink cold or make a milkshake out of it Drink one instead of milk with cereal or snacks Use as a coffee creamer   Available at the following grocery stores and pharmacies:           * [employer]             * [employer] * Costco  * Rite Aid          * Walmart * Sam's Club  * Walgreens               * Target * BJ's   * CVS             * [employer]   * [facility] [phone]            For COUPONS visit: www.ensure.com/join or [email or web address]/sign-[email or web address]   Suggested Substitutions Ensure Plus = Boost Plus = Carnation Breakfast Essentials = Boost Compact Ensure Active Clear = Boost Breeze Glucerna Shake = Boost Glucose Control = Carnation Breakfast Essentials SUGAR FREE

## 2018-10-07 NOTE — Progress Notes (Addendum)
[facility] - [place] [facility]   PATIENT NAME: Kara Mierzejewski    MR#:  [id number]  DATE OF BIRTH:  [birth year]  SUBJECTIVE:  CHIEF COMPLAINT:   Chief Complaint  Patient presents with  . Fall  . Altered Mental Status  Patient seen and evaluated today Awake and alert and responds to all verbal commands Moves all extremities Oriented to self time and place No complaints of any chest pain, shortness of breath REVIEW OF SYSTEMS:    ROS Review of Systems  Constitutional: weakness   HENT: Negative.   Eyes: Negative.   Respiratory: Negative.   Cardiovascular: Negative.   Gastrointestinal: Negative.   Genitourinary: Negative.   Musculoskeletal: Negative.   Skin: Bruises over the right arm forearm and right buttock area Neurological: Negative.   Endo/Heme/Allergies: Negative.   Psychiatric/Behavioral: Negative.   All other systems reviewed and are negative.  DRUG ALLERGIES:  No Known Allergies  VITALS:  Blood pressure 112/70, pulse 88, temperature 98.4 F (36.9 C), temperature source Oral, resp. rate 18, height 5\' 7"  (1.702 m), weight 79.8 kg, SpO2 91 %.  PHYSICAL EXAMINATION:   Physical Exam  GENERAL:  52 y.o.-year-old patient lying in the bed in no acute distress EYES: Pupils equal, round, reactive to light and accommodation. No scleral icterus. Extraocular muscles intact.  HEENT: Head atraumatic, normocephalic. Oropharynx dry and nasopharynx clear.  NECK:  Supple, no jugular venous distention. No thyroid enlargement, no tenderness.  LUNGS: Normal breath sounds bilaterally, no wheezing, rales, rhonchi. No use of accessory muscles of respiration.  CARDIOVASCULAR: S1, S2 normal. No murmurs, rubs, or gallops.  ABDOMEN: Soft, nontender, nondistended. Bowel sounds present. No organomegaly or mass.  EXTREMITIES: No clubbing or edema b/l.  Has ecchymosis over the right arm forearm and right lower extremity NEUROLOGIC: Cranial nerves II through XII are  intact. No focal Motor or sensory deficits b/l.   PSYCHIATRIC: The patient is alert and oriented x 3.  SKIN: Multiple bruises over the right arm forearm and right buttock area  LABORATORY PANEL:   CBC Recent Labs  Lab 10/05/18 1113  WBC 7.8  HGB 13.9  HCT 40.7  PLT 162   ------------------------------------------------------------------------------------------------------------------ Chemistries  Recent Labs  Lab 10/05/18 1113 10/07/18 0309  NA 137 140  K 3.8 2.7*  CL 95* 105  CO2 25 26  GLUCOSE 109* 102*  BUN 8 7  CREATININE 0.53* 0.33*  CALCIUM 9.0 7.8*  MG 1.7 1.7  AST 111*  --   ALT 34  --   ALKPHOS 37*  --   BILITOT 4.1*  --    ------------------------------------------------------------------------------------------------------------------  Cardiac Enzymes Recent Labs  Lab 10/05/18 1113  TROPONINI <0.03   ------------------------------------------------------------------------------------------------------------------  RADIOLOGY:  Dg Forearm Right  Result Date: 10/05/2018 CLINICAL DATA:  RIGHT forearm bruising EXAM: RIGHT FOREARM - 2 VIEW COMPARISON:  None FINDINGS: Osseous mineralization normal. Wrist and elbow joint alignments normal. No acute fracture, dislocation, or bone destruction. Soft tissue swelling of RIGHT forearm and elbow. IMPRESSION: No acute osseous abnormalities. Diffuse soft tissue swelling. Electronically Signed   By: Ulyses Southward M.D.   On: 10/05/2018 12:27   Ct Head Wo Contrast  Result Date: 10/05/2018 CLINICAL DATA:  Frequent falls, altered mental status, disorientation, confusion, bruising at scattered sites EXAM: CT HEAD WITHOUT CONTRAST TECHNIQUE: Contiguous axial images were obtained from the base of the skull through the vertex without intravenous contrast. Sagittal and coronal MPR images reconstructed from axial data set. COMPARISON:  None; correlation MR brain 06/12/2018 FINDINGS:  Brain: Mild generalized atrophy. Normal  ventricular morphology. No midline shift or mass effect. Otherwise normal appearance of brain parenchyma. No intracranial hemorrhage, mass lesion, or acute infarction. No extra-axial fluid collections. Vascular: No hyperdense vessels Skull: Intact with note of a stable small lucent focus at the posterior RIGHT parietal bone Sinuses/Orbits: Minimal mucosal thickening in ethmoid air cells. Remaining sinuses clear. Other: N/A IMPRESSION: Mild generalized atrophy. No acute intracranial abnormalities. Electronically Signed   By: Ulyses SouthwardMark  Boles M.D.   On: 10/05/2018 11:39   Dg Chest Port 1 View  Result Date: 10/05/2018 CLINICAL DATA:  Confusion, altered mental status, frequent falls EXAM: PORTABLE CHEST 1 VIEW COMPARISON:  Portable exam 1115 hours compared to CT chest of 07/25/2007 FINDINGS: Normal heart size, mediastinal contours, and pulmonary vascularity. Lungs clear. No pleural effusion or pneumothorax. Bones demineralized. IMPRESSION: No acute abnormalities. Electronically Signed   By: Ulyses SouthwardMark  Boles M.D.   On: 10/05/2018 11:41     ASSESSMENT AND PLAN:  52 year old male patient with history of dementia, alcohol dependence and abuse currently under hospitalist service for frequent falls and confusion  -Acute metabolic encephalopathy improved Alcohol induced IV fluid hydration to continue Follow-up EEG Supportive care  -Acute hypokalemia Replace potassium aggressively  -Acute hypomagnesemia Replace magnesium intravenously  -Alcoholic dementia Thiamine and folic acid supplements Supportive care  -Rhabdomyolysis IV fluids  -Multiple ecchymosis secondary to fall Avoid blood thinner medications Physical therapy evaluation for gait and balance training  -Alcohol abuse CIWA protocol  -DVT prophylaxis sequential compression device to lower extremities  -Gait instability and ambulatory dysfunction PT evaluation for gait and balance training  All the records are reviewed and case discussed  with Care Management/Social Worker. Management plans discussed with the patient, family and they are in agreement.  CODE STATUS: Full code  DVT Prophylaxis: SCDs  TOTAL TIME TAKING CARE OF THIS PATIENT: 34 minutes.   POSSIBLE D/C IN  3 to 4 DAYS, DEPENDING ON CLINICAL CONDITION.  Ihor AustinPavan Manha Amato M.D on 10/07/2018 at 11:12 AM  Between 7am to 6pm - Pager - [phone]  After 6pm go to [email or web address] - password [email or web address]  [facility] [place] [facility]  Office  [phone]  CC: Primary care physician; Center, [facility]  Note: This dictation was prepared with Dragon dictation along with smaller phrase technology. Any transcriptional errors that result from this process are unintentional.

## 2018-10-07 NOTE — Procedures (Signed)
History: 52 year old male being evaluated for confusion  Sedation: None  Technique: This is a 21 channel routine scalp EEG performed at the bedside with bipolar and monopolar montages arranged in accordance to the international 10/20 system of electrode placement. One channel was dedicated to EKG recording.    Background: The background consists of intermixed alpha and beta activities. There is a well defined posterior dominant rhythm of 10-11 Hz that attenuates with eye opening. Sleep is recorded with normal appearing structures.   Photic stimulation: Physiologic driving is present  EEG Abnormalities: None  Clinical Interpretation: This normal EEG is recorded in the waking and sleep state. There was no seizure or seizure predisposition recorded on this study. Please note that a normal EEG does not preclude the possibility of epilepsy.   Phillip SlotMcNeill [name], MD [facility] [phone]  If 7pm- 7am, please page neurology on call as listed in AMION.

## 2018-10-07 NOTE — Plan of Care (Signed)
Eeg today more alert. Ivfs cont rate decreased to 75 ml / hr.

## 2018-10-07 NOTE — Progress Notes (Signed)
PT Cancellation Note  Patient Details Name: Phillip Juarez MRN: [id number] DOB: 12/13/1965   Cancelled Treatment:    Reason Eval/Treat Not Completed: Medical issues which prohibited therapy.  Pt's potassium 2.7.  Per protocol, will hold PT until pt more medically appropriate. Will continue to follow acutely.    Encarnacion ChuAshley Garrette Caine PT, DPT 10/07/2018, 8:33 AM

## 2018-10-07 NOTE — Progress Notes (Signed)
Routine EEG completed, results pending. 

## 2018-10-07 NOTE — Progress Notes (Signed)
Dr. Marjie SkiffSridharan notified of critical potassium. New orders given

## 2018-10-07 NOTE — Progress Notes (Signed)
Initial Nutrition Assessment  DOCUMENTATION CODES:   Not applicable  INTERVENTION:  Provide Ensure Enlive po TID, each supplement provides 350 kcal and 20 grams of protein. Patient prefers chocolate.  Continue MVI daily, thiamine 655 mg daily, folic acid 1 mg daily.  Provide vitamin C 500 mg BID.  Monitor magnesium, potassium, and phosphorus daily for at least 3 days, MD to replete as needed, as pt is at risk for refeeding syndrome given hx of EtOH abuse.  Strongly encouraged patient to continue drinking Ensure and taking his vitamins at home after discharge.  NUTRITION DIAGNOSIS:   Inadequate oral intake related to decreased appetite, other (see comment)(EtOH abuse) as evidenced by per patient/family report.  GOAL:   Patient will meet greater than or equal to 90% of their needs  MONITOR:   PO intake, Supplement acceptance, Labs, Weight trends, Skin, I & O's  REASON FOR ASSESSMENT:   Malnutrition Screening Tool, Consult Assessment of nutrition requirement/status, Poor PO  ASSESSMENT:   52 year old male with PMHx of dementia, EtOH abuse who is admitted after frequent falls and confusion found to have alcohol-induced acute metabolic encephalopathy, hypokalemia, hypomagnesemia.   Met with patient at bedside. He reports he has had a decreased appetite and intake for a while now. He lives alone and it is difficult for him to prepare food for himself. When he can cook a full meal he will have meat with potatoes and vegetables. However, he reports there are days he will only eat snacks or not eat anything at all. RD also suspects EtOH abuse is playing a part in decreased appetite/intake. Patient reports he has not been taking his vitamin/mineral regimen at home. He reports he has been falling a lot lately and is bruising very easily. He has a very low intake of fruits and vegetables so he is at risk for vitamin C deficiency which may be causing the ecchymosis and petechiae. He is  amenable to drinking chocolate Ensure to help meet his calorie/protein needs. RD noted poor dentition on NFPE but patient denies any difficulty chewing.  UBW was 192 lbs (87.3 kg). There is no weight history in chart to trend. He is currently 79.8 kg (175.9 lbs).  Meal Completion: 50% of breakfast this morning  Medications reviewed and include: Xanax 1 mg TID, folic acid 1 mg daily, lactulose 20 grams BID, MVI daily, thiamine 100 mg daily, NS @ 75 mL/hr, magnesium sulfate 1 gram IV once today.  Labs reviewed: Potassium 2.7, Creatinine 0.33, Magnesium 1.7. On 11/23 vitamin B12 465.  Patient is at risk of malnutrition but does not meet criteria for malnutrition at this time.  NUTRITION - FOCUSED PHYSICAL EXAM:    Most Recent Value  Orbital Region  No depletion  Upper Arm Region  No depletion  Thoracic and Lumbar Region  No depletion  Buccal Region  No depletion  Temple Region  Mild depletion  Clavicle Bone Region  Mild depletion  Clavicle and Acromion Bone Region  Mild depletion  Scapular Bone Region  No depletion  Dorsal Hand  No depletion  Patellar Region  No depletion  Anterior Thigh Region  No depletion  Posterior Calf Region  Mild depletion  Edema (RD Assessment)  Mild  Hair  Reviewed  Eyes  Reviewed  Mouth  Reviewed [poor dentition]  Skin  Reviewed [ecchymosis and petechiae]  Nails  Reviewed     Diet Order:   Diet Order            Diet regular  Room service appropriate? Yes; Fluid consistency: Thin  Diet effective now             EDUCATION NEEDS:   Education needs have been addressed  Skin:  Skin Assessment: Reviewed RN Assessment(petechiae and echhymosis)  Last BM:  10/05/2018  Height:   Ht Readings from Last 1 Encounters:  10/05/18 _0  (1.702 m)   Weight:   Wt Readings from Last 1 Encounters:  10/05/18 79.8 kg   Ideal Body Weight:  67.3 kg  BMI:  Body mass index is 27.55 kg/m.  Estimated Nutritional Needs:   Kcal:  1930-2250 (MSJ x  1.2-1.4)  Protein:  95-105 grams (1.2-1.3 grams/kg)  Fluid:  1.9-2.2 L/day (1 mL/kcal)  Willey Blade, MS, RD, LDN Office: [phone] Pager: [phone] After Hours/Weekend Pager: [phone]

## 2018-10-08 LAB — BASIC METABOLIC PANEL
Anion gap: 6 (ref 5–15)
BUN: 5 mg/dL — AB (ref 6–20)
CO2: 26 mmol/L (ref 22–32)
CREATININE: 0.37 mg/dL — AB (ref 0.61–1.24)
Calcium: 8.7 mg/dL — ABNORMAL LOW (ref 8.9–10.3)
Chloride: 106 mmol/L (ref 98–111)
GFR calc Af Amer: >60 mL/min (ref >60)
Glucose, Bld: 106 mg/dL — ABNORMAL HIGH (ref 70–99)
Potassium: 3.6 mmol/L (ref 3.5–5.1)
SODIUM: 138 mmol/L (ref 135–145)

## 2018-10-08 LAB — PHOSPHORUS: Phosphorus: 1.7 mg/dL — ABNORMAL LOW (ref 2.5–4.6)

## 2018-10-08 LAB — MAGNESIUM: MAGNESIUM: 1.8 mg/dL (ref 1.7–2.4)

## 2018-10-08 MED ORDER — MAGNESIUM SULFATE 2 GM/50ML IV SOLN
2.0000 g | Freq: Once | INTRAVENOUS | Status: AC
  Administered 2018-10-08: 10:00:00 2 g via INTRAVENOUS
  Filled 2018-10-08: qty 50

## 2018-10-08 MED ORDER — POTASSIUM CHLORIDE CRYS ER 20 MEQ PO TBCR
40.0000 meq | EXTENDED_RELEASE_TABLET | Freq: Once | ORAL | Status: AC
  Administered 2018-10-08: 40 meq via ORAL
  Filled 2018-10-08: qty 2

## 2018-10-08 MED ORDER — ALPRAZOLAM 1 MG PO TABS
2.0000 mg | ORAL_TABLET | Freq: Three times a day (TID) | ORAL | Status: DC
  Administered 2018-10-08 – 2018-10-09 (×4): 2 mg via ORAL
  Filled 2018-10-08 (×4): qty 2

## 2018-10-08 MED ORDER — K PHOS MONO-SOD PHOS DI & MONO 155-852-130 MG PO TABS
500.0000 mg | ORAL_TABLET | ORAL | Status: AC
  Administered 2018-10-08 (×4): 500 mg via ORAL
  Filled 2018-10-08 (×4): qty 2

## 2018-10-08 MED ORDER — MAGNESIUM OXIDE 400 (241.3 MG) MG PO TABS
800.0000 mg | ORAL_TABLET | Freq: Once | ORAL | Status: AC
  Administered 2018-10-08: 10:00:00 800 mg via ORAL
  Filled 2018-10-08: qty 2

## 2018-10-08 NOTE — Progress Notes (Signed)
[facility] - [place] at [facility]   PATIENT NAME: Phillip ManuelJeffrey Juarez    MR#:  [id number]  DATE OF BIRTH:  03/31/1966  SUBJECTIVE:  CHIEF COMPLAINT:   Chief Complaint  Patient presents with  . Fall  . Altered Mental Status  Patient seen and evaluated today Awake and alert and responds to all verbal commands this morning Moves all extremities Was agitated and confused last night Patient takes benzodiazepines for a long period of time No complaints of any chest pain, shortness of breath REVIEW OF SYSTEMS:    ROS Review of Systems  Constitutional: weakness   HENT: Negative.   Eyes: Negative.   Respiratory: Negative.   Cardiovascular: Negative.   Gastrointestinal: Negative.   Genitourinary: Negative.   Musculoskeletal: Negative.   Skin: Bruises over the right arm forearm and right buttock area Neurological: Negative.   Endo/Heme/Allergies: Negative.   Psychiatric/Behavioral: Negative.   All other systems reviewed and are negative.  DRUG ALLERGIES:  No Known Allergies  VITALS:  Blood pressure 112/71, pulse 100, temperature 98 F (36.7 C), temperature source Oral, resp. rate 20, height 5\' 7"  (1.702 m), weight 79.8 kg, SpO2 94 %.  PHYSICAL EXAMINATION:   Physical Exam  GENERAL:  52 y.o.-year-old patient lying in the bed in no acute distress EYES: Pupils equal, round, reactive to light and accommodation. No scleral icterus. Extraocular muscles intact.  HEENT: Head atraumatic, normocephalic. Oropharynx dry and nasopharynx clear.  NECK:  Supple, no jugular venous distention. No thyroid enlargement, no tenderness.  LUNGS: Normal breath sounds bilaterally, no wheezing, rales, rhonchi. No use of accessory muscles of respiration.  CARDIOVASCULAR: S1, S2 normal. No murmurs, rubs, or gallops.  ABDOMEN: Soft, nontender, nondistended. Bowel sounds present. No organomegaly or mass.  EXTREMITIES: No clubbing or edema b/l.  Has ecchymosis over the right arm forearm and  right lower extremity and buttock area improving NEUROLOGIC: Cranial nerves II through XII are intact. No focal Motor or sensory deficits b/l.   PSYCHIATRIC: The patient is alert and oriented x 3.  SKIN: Multiple bruises over the right arm forearm and right buttock area  LABORATORY PANEL:   CBC Recent Labs  Lab 10/05/18 1113  WBC 7.8  HGB 13.9  HCT 40.7  PLT 162   ------------------------------------------------------------------------------------------------------------------ Chemistries  Recent Labs  Lab 10/05/18 1113  10/08/18 0338  NA 137   < > 138  K 3.8   < > 3.6  CL 95*   < > 106  CO2 25   < > 26  GLUCOSE 109*   < > 106*  BUN 8   < > 5*  CREATININE 0.53*   < > 0.37*  CALCIUM 9.0   < > 8.7*  MG 1.7   < > 1.8  AST 111*  --   --   ALT 34  --   --   ALKPHOS 37*  --   --   BILITOT 4.1*  --   --    < > = values in this interval not displayed.   ------------------------------------------------------------------------------------------------------------------  Cardiac Enzymes Recent Labs  Lab 10/05/18 1113  TROPONINI <0.03   ------------------------------------------------------------------------------------------------------------------  RADIOLOGY:  No results found.   ASSESSMENT AND PLAN:  52 year old male patient with history of dementia, alcohol dependence and abuse currently under hospitalist service for frequent falls and confusion  -Acute metabolic encephalopathy improved Alcohol induced IV fluid hydration will be stopped today Encouraged oral intake EEG showed no abnormality Supportive care  -Acute hypokalemia resolved  -Acute hypomagnesemia improved  -  Alcoholic dementia Thiamine and folic acid supplements Supportive care  -Multiple ecchymosis secondary to fall Avoid blood thinner medications Physical therapy evaluation for gait and balance training to continue  -Alcohol abuse CIWA protocol  -Benzodiazepine withdrawal Resume Xanax at  home dose  -DVT prophylaxis sequential compression device to lower extremities  -Gait instability and ambulatory dysfunction PT evaluation for gait and balance training and therapy to continue  -Disposition Home with home health services in AM  All the records are reviewed and case discussed with Care Management/Social Worker. Management plans discussed with the patient, family and they are in agreement.  CODE STATUS: Full code  DVT Prophylaxis: SCDs  TOTAL TIME TAKING CARE OF THIS PATIENT: 33 minutes.   POSSIBLE D/C IN  3 to 4 DAYS, DEPENDING ON CLINICAL CONDITION.  Ihor Austin M.D on 10/08/2018 at 2:37 PM  Between 7am to 6pm - Pager - [phone]  After 6pm go to [email or web address] - password [email or web address]  [facility] [place] [facility]  Office  [phone]  CC: Primary care physician; Center, [facility]  Note: This dictation was prepared with Dragon dictation along with smaller phrase technology. Any transcriptional errors that result from this process are unintentional.
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
## 2018-10-08 NOTE — Evaluation (Signed)
Physical Therapy Evaluation Patient Details Name: Phillip Juarez MRN: [id number] DOB: 07/10/1966 Today's Date: 10/08/2018   History of Present Illness  presented to ER secondary to falls, AMS; admitted due to acute encephalopathy related to ETOH abuse.  Imaging to R forearm negative for acute injury; head CT negative for acute infarct/injury.  Clinical Impression  Upon evaluation, patient alert and oriented to basic information; follows commands and demonstrates good effort/participation with session.  Bilat UE/LE strength and ROM grossly symmetrical and WFL; denies pain.  Did note significant area of ecchymosis to R post hip/buttocks; RN informed/aware (patient denies pain to area, tolerates WBing without difficulty).  Able to complete bed mobility with sup; sit/stand, basic transfers and gait (100') with RW, min assist.  Generally unsteady with inconsistent gait pattern, limited balance reactions; R lateral LOB x1 requiring min/mod assist to correct and prevent fall.  Do recommend continued use of RW and +1 assist at all times at this point. Of note, mild episode of desat to 87% with supine to sit, but quickly recovers and maintains >92% on RA throughout session.  HR 110s at rest and with activity. Would benefit from skilled PT to address above deficits and promote optimal return to PLOF; recommend transition to STR upon discharge from acute hospitalization.  Will continue to monitor progress and update recommendations as functional mobility improves.     Follow Up Recommendations SNF    Equipment Recommendations  Rolling walker with 5" wheels    Recommendations for Other Services       Precautions / Restrictions Precautions Precautions: Fall Restrictions Weight Bearing Restrictions: No      Mobility  Bed Mobility Overal bed mobility: Needs Assistance Bed Mobility: Supine to Sit     Supine to sit: Supervision     General bed mobility comments: increased time/effort required  to complete movement transition  Transfers Overall transfer level: Needs assistance Equipment used: Rolling walker (2 wheeled) Transfers: Sit to/from Stand Sit to Stand: Min assist         General transfer comment: cuing for hand placement  Ambulation/Gait Ambulation/Gait assistance: Min assist Gait Distance (Feet): 100 Feet Assistive device: Rolling walker (2 wheeled)       General Gait Details: forward flexed posture with crouched knees, excessive sway to R (single R lateral LOB, min/mod assist to correct).  Decreased step height/length with flat foot contact; decreased cadence/gait speed (increased fall risk). Unsafe to attempt without RW at this time.  HR/SaO2 stable and WFL throughout gait trial.  Stairs            Wheelchair Mobility    Modified Rankin (Stroke Patients Only)       Balance Overall balance assessment: Needs assistance Sitting-balance support: No upper extremity supported;Feet supported Sitting balance-Leahy Scale: Good     Standing balance support: Bilateral upper extremity supported Standing balance-Leahy Scale: Fair Standing balance comment: excessive sway to R LE, requires UE support and +1 at all times                             Pertinent Vitals/Pain Pain Assessment: No/denies pain    Home Living Family/patient expects to be discharged to:: Private residence Living Arrangements: Alone   Type of Home: House Home Access: Stairs to enter Entrance Stairs-Rails: Left Entrance Stairs-Number of Steps: 5-6 Home Layout: One level Home Equipment: Walker - 2 wheels Additional Comments: Patient questionable historian; will verify social history and PLOF with family as available.  Prior Function Level of Independence: Independent with assistive device(s)         Comments: Patient reports indep with ADLs, household and limited community mobilization; intermittent use of RW (esp for longer distances).  Sisters provide  frequent check in and assist as needed.     Hand Dominance        Extremity/Trunk Assessment   Upper Extremity Assessment Upper Extremity Assessment: Generalized weakness(grossly at least 4-/5 throughout)    Lower Extremity Assessment Lower Extremity Assessment: Generalized weakness(grossly at least 4-/5 throughout; significant ecchymosis to R buttocks/posterior hip)       Communication   Communication: (mumbled)  Cognition Arousal/Alertness: Awake/alert Behavior During Therapy: WFL for tasks assessed/performed Overall Cognitive Status: No family/caregiver present to determine baseline cognitive functioning                                 General Comments: oriented to basic information; mild confusion at times.  Does follow commands, maintains topic of conversation.      General Comments      Exercises Other Exercises Other Exercises: Rolling bilat, min assist, for hygiene, clothing management after incontinent bladder.   Assessment/Plan    PT Assessment Patient needs continued PT services  PT Problem List Decreased activity tolerance;Decreased balance;Decreased mobility;Decreased coordination;Decreased cognition;Decreased knowledge of use of DME;Decreased safety awareness;Decreased knowledge of precautions;Cardiopulmonary status limiting activity       PT Treatment Interventions DME instruction;Gait training;Stair training;Functional mobility training;Therapeutic activities;Therapeutic exercise;Balance training;Patient/family education    PT Goals (Current goals can be found in the Care Plan section)  Acute Rehab PT Goals Patient Stated Goal: to get out of the bed PT Goal Formulation: With patient Time For Goal Achievement: 10/22/18 Potential to Achieve Goals: Good    Frequency Min 2X/week   Barriers to discharge Decreased caregiver support      Co-evaluation               AM-PAC PT "6 Clicks" Mobility  Outcome Measure Help needed  turning from your back to your side while in a flat bed without using bedrails?: None Help needed moving from lying on your back to sitting on the side of a flat bed without using bedrails?: None Help needed moving to and from a bed to a chair (including a wheelchair)?: A Little Help needed standing up from a chair using your arms (e.g., wheelchair or bedside chair)?: A Little Help needed to walk in hospital room?: A Little Help needed climbing 3-5 steps with a railing? : A Little 6 Click Score: 20    End of Session Equipment Utilized During Treatment: Gait belt Activity Tolerance: Patient tolerated treatment well Patient left: in chair;with call bell/phone within reach;with chair alarm set Nurse Communication: Mobility status PT Visit Diagnosis: Muscle weakness (generalized) (M62.81);Difficulty in walking, not elsewhere classified (R26.2)    Time: 8119-1478 PT Time Calculation (min) (ACUTE ONLY): 34 min   Charges:   PT Evaluation $PT Eval Moderate Complexity: 1 Mod PT Treatments $Therapeutic Activity: 8-22 mins        Alessandra Sawdey H. Manson Passey, PT, DPT, NCS 10/08/18, 9:50 AM [phone]   Evaluation/treatment session was lead and completed by Lisbeth Renshaw, SPT; directly observed, guided and documented by Cephus Slater, PT.

## 2018-10-08 NOTE — Plan of Care (Signed)

## 2018-10-08 NOTE — Care Management (Signed)
Highest CIWA score today is 4.  He has declined services offered by CSW for etoh and grief.. Physical therapy recommended skilled nursing placement and patient declines

## 2018-10-09 LAB — MAGNESIUM: MAGNESIUM: 2.1 mg/dL (ref 1.7–2.4)

## 2018-10-09 LAB — CK: CK TOTAL: 86 U/L (ref 49–397)

## 2018-10-09 LAB — PHOSPHORUS: PHOSPHORUS: 3.5 mg/dL (ref 2.5–4.6)

## 2018-10-09 MED ORDER — ENSURE ENLIVE PO LIQD: 237.0000 mL | Freq: Three times a day (TID) | ORAL | 12 refills | Status: AC

## 2018-10-09 MED ORDER — FOLIC ACID 1 MG PO TABS: 1.0000 mg | ORAL_TABLET | Freq: Every day | ORAL | 0 refills | Status: AC

## 2018-10-09 NOTE — Progress Notes (Signed)
Pt has been discharged home.  Discharge papers given and explained to pt and sister, Samuel JesterDana Lavallie.  Sister verbalized understanding.  Meds and f/u appointments reviewed. Rx to be picked up from pharmacy.  Pt's sister made aware.

## 2018-10-09 NOTE — Care Management (Signed)
RNCM spoke with patient again regarding home health PT and he has declined however he agrees to rolling walker which [facility] [place] [facility]. No other RNCM needs. RN updated.

## 2018-10-09 NOTE — Progress Notes (Signed)
SATURATION QUALIFICATIONS: (This note is used to comply with regulatory documentation for home oxygen)  Patient Saturations on Room Air at Rest = 90%  Patient Saturations on Room Air while Ambulating = 89%  Patient Saturations on 0 Liters of oxygen while Ambulating 89%  Please briefly explain why patient needs home oxygen:

## 2018-10-09 NOTE — Discharge Summary (Addendum)
[facility] - [place] at [facility]   PATIENT NAME: Phillip Juarez    MR#:  [id number]  DATE OF BIRTH:  09/23/1966  DATE OF ADMISSION:  10/05/2018 ADMITTING PHYSICIAN: Adrian SaranSital Mody, MD  DATE OF DISCHARGE: 10/09/2018  PRIMARY CARE PHYSICIAN: Center, [facility]   ADMISSION DIAGNOSIS:  Confusion [R41.0] Alcohol abuse Alcohol-related dementia DISCHARGE DIAGNOSIS:  Metabolic encephalopathy Alcohol-related dementia Alcohol abuse Dehydration Rhabdomyolysis  SECONDARY DIAGNOSIS:  History reviewed. No pertinent past medical history.   ADMITTING HISTORY Phillip Juarez  is a 52 y.o. male with a known history of EtOh dependence who presents to the ER via EMS due to falls and increased confusion.He is being followed by Dr. Malvin JohnsPotter for what appears to be alcohol induced dementia.Family was in the room and now has left.HPI taken from nurse and ER physician. Apparently the family members went to check on him yesterday and he was slightly confused and they were concerned so they called EMS.  At that time patient was more awake and alert and refused EMS transport.  Today when they checked on him he was very confused and disheveled and was brought to the ER for further evaluation.  In the emergency room he remains in a confused state.  Family members are also concerned due to recurrent falls.  Patient has bruising on the right hip and arm.  He is able to move all extremities.  HOSPITAL COURSE:  Patient was admitted to medical floor.  Patient was put on CIWA protocol for alcohol withdrawal.  He received thiamine and folic acid supplements.  Patient had elevated CK level at the time of admission secondary to fall and had rhabdomyolysis. IV fluid hydration and aggressive potassium and magnesium were supplemented.  Received physical therapy for gait instability and weakness.  Mental status improved.  Patient was evaluated by neurology during the stay in the hospital.  Patient was  worked up with CT head which showed atrophy but no acute abnormality.  Chest x-ray did not reveal any pneumonia.  X-ray of the right forearm no fractures.  His ecchymosis over the right upper extremity and right buttock area also improved.  CK level has normalized. patient does not want to go to rehab.  He wants to go to home with home health services and physical therapy services.  Oxygen saturation was around 90% at rest and on ambulation. Disposition plan was discussed with patient's sister.  Alcohol cessation has been counseled to the patient.  Substance abuse counseling given to the patient in detail.  CONSULTS OBTAINED:    DRUG ALLERGIES:  No Known Allergies  DISCHARGE MEDICATIONS:   Allergies as of 10/09/2018   No Known Allergies     Medication List    TAKE these medications   alprazolam 2 MG tablet Commonly known as:  XANAX Take 2 mg by mouth 4 (four) times daily.   amitriptyline 150 MG tablet Commonly known as:  ELAVIL Take 150 mg by mouth at bedtime.   feeding supplement (ENSURE ENLIVE) Liqd Take 237 mLs by mouth 3 (three) times daily between meals.   folic acid 1 MG tablet Commonly known as:  FOLVITE Take 1 tablet (1 mg total) by mouth daily. Start taking on:  10/10/2018   thiamine 100 MG tablet Commonly known as:  VITAMIN B-1 Take 100 mg by mouth daily.   Vitamin D (Ergocalciferol) 1.25 MG (50000 UT) Caps capsule Commonly known as:  DRISDOL Take 100,000 Units by mouth every 7 (seven) days.  Durable Medical Equipment  (From admission, onward)         Start     Ordered   10/09/18 1027  For home use only DME Walker rolling  Once    Question:  Patient needs a walker to treat with the following condition  Answer:  Gait instability   10/09/18 1026          Today  Patient seen and evaluated today Tolerating diet well No complaints We will discharge him home with a rolling walker VITAL SIGNS:  Blood pressure 120/73, pulse (!) 104,  temperature 98.8 F (37.1 C), temperature source Oral, resp. rate 18, height 5\' 7"  (1.702 m), weight 79.8 kg, SpO2 90 %.  I/O:    Intake/Output Summary (Last 24 hours) at 10/09/2018 1028 Last data filed at 10/09/2018 0942 Gross per 24 hour  Intake 730 ml  Output 300 ml  Net 430 ml    PHYSICAL EXAMINATION:  Physical Exam  GENERAL:  52 y.o.-year-old patient lying in the bed with no acute distress.  LUNGS: Normal breath sounds bilaterally, no wheezing, rales,rhonchi or crepitation. No use of accessory muscles of respiration.  CARDIOVASCULAR: S1, S2 normal. No murmurs, rubs, or gallops.  ABDOMEN: Soft, non-tender, non-distended. Bowel sounds present. No organomegaly or mass.  NEUROLOGIC: Moves all 4 extremities. PSYCHIATRIC: The patient is alert and oriented x 3.  SKIN: No obvious rash, lesion, or ulcer.   DATA REVIEW:   CBC Recent Labs  Lab 10/05/18 1113  WBC 7.8  HGB 13.9  HCT 40.7  PLT 162    Chemistries  Recent Labs  Lab 10/05/18 1113  10/08/18 0338 10/09/18 0301  NA 137   < > 138  --   K 3.8   < > 3.6  --   CL 95*   < > 106  --   CO2 25   < > 26  --   GLUCOSE 109*   < > 106*  --   BUN 8   < > 5*  --   CREATININE 0.53*   < > 0.37*  --   CALCIUM 9.0   < > 8.7*  --   MG 1.7   < > 1.8 2.1  AST 111*  --   --   --   ALT 34  --   --   --   ALKPHOS 37*  --   --   --   BILITOT 4.1*  --   --   --    < > = values in this interval not displayed.    Cardiac Enzymes Recent Labs  Lab 10/05/18 1113  TROPONINI <0.03    Microbiology Results  Results for orders placed or performed during the hospital encounter of 10/05/18  Urine culture     Status: None   Collection Time: 10/05/18 11:39 AM  Result Value Ref Range Status   Specimen Description   Final    URINE, RANDOM Performed at [facility], [address] [place], [place] [address]    Special Requests   Final    NONE Performed at [facility], [address], [place], [place]  [address]    Culture   Final    NO GROWTH Performed at [facility], [address], [place], [place] [address]    Report Status 10/06/2018 FINAL  Final    RADIOLOGY:  No results found.  Follow up with PCP in 1 week.  Management plans discussed with the patient, family and  they are in agreement.  CODE STATUS: Full code    Code Status Orders  (From admission, onward)         Start     Ordered   10/05/18 1307  Full code  Continuous     10/05/18 1306        Code Status History    This patient has a current code status but no historical code status.    Advance Directive Documentation     Most Recent Value  Type of Advance Directive  Healthcare Power of Attorney  Pre-existing out of facility DNR order (yellow form or pink MOST form)  -  "MOST" Form in Place?  -      TOTAL TIME TAKING CARE OF THIS PATIENT ON DAY OF DISCHARGE: more than 35 minutes.   Ihor Austin M.D on 10/09/2018 at 10:28 AM  Between 7am to 6pm - Pager - [phone]  After 6pm go to [email or web address] - password [email or web address]  [facility] [place] [facility]  Office  [phone]  CC: Primary care physician; Center, [facility]  Note: This dictation was prepared with Dragon dictation along with smaller phrase technology. Any transcriptional errors that result from this process are unintentional.

## 2018-10-10 LAB — VITAMIN B1: VITAMIN B1 (THIAMINE): 83.3 nmol/L (ref 66.5–200.0)

## 2018-11-18 ENCOUNTER — Encounter: Payer: Self-pay | Admitting: Emergency Medicine

## 2018-11-18 ENCOUNTER — Other Ambulatory Visit: Payer: Self-pay

## 2018-11-18 DIAGNOSIS — K7460 Unspecified cirrhosis of liver: Secondary | ICD-10-CM | POA: Insufficient documentation

## 2018-11-18 DIAGNOSIS — R410 Disorientation, unspecified: Secondary | ICD-10-CM | POA: Diagnosis not present

## 2018-11-18 DIAGNOSIS — F17210 Nicotine dependence, cigarettes, uncomplicated: Secondary | ICD-10-CM | POA: Diagnosis not present

## 2018-11-18 DIAGNOSIS — Z79899 Other long term (current) drug therapy: Secondary | ICD-10-CM | POA: Diagnosis not present

## 2018-11-18 DIAGNOSIS — F419 Anxiety disorder, unspecified: Secondary | ICD-10-CM | POA: Insufficient documentation

## 2018-11-18 DIAGNOSIS — R270 Ataxia, unspecified: Principal | ICD-10-CM | POA: Insufficient documentation

## 2018-11-18 DIAGNOSIS — F10239 Alcohol dependence with withdrawal, unspecified: Secondary | ICD-10-CM | POA: Insufficient documentation

## 2018-11-18 DIAGNOSIS — K8020 Calculus of gallbladder without cholecystitis without obstruction: Secondary | ICD-10-CM | POA: Diagnosis not present

## 2018-11-18 DIAGNOSIS — W19XXXA Unspecified fall, initial encounter: Secondary | ICD-10-CM | POA: Diagnosis not present

## 2018-11-18 DIAGNOSIS — R296 Repeated falls: Secondary | ICD-10-CM | POA: Diagnosis not present

## 2018-11-18 DIAGNOSIS — E512 Wernicke's encephalopathy: Secondary | ICD-10-CM | POA: Insufficient documentation

## 2018-11-18 DIAGNOSIS — Z8249 Family history of ischemic heart disease and other diseases of the circulatory system: Secondary | ICD-10-CM | POA: Diagnosis not present

## 2018-11-18 DIAGNOSIS — M6282 Rhabdomyolysis: Secondary | ICD-10-CM | POA: Diagnosis not present

## 2018-11-18 DIAGNOSIS — E876 Hypokalemia: Secondary | ICD-10-CM | POA: Insufficient documentation

## 2018-11-18 NOTE — ED Triage Notes (Addendum)
Pt presents to ED with c/o multiple falls. Pt states he started a new medication to help him sleep at night but noticed he began falling frequently. Pt reports that he has stopped taking the medication. Currently having bilateral knee pain and left ankle pain after falling about an hour ago.

## 2018-11-19 ENCOUNTER — Encounter: Payer: Self-pay | Admitting: Internal Medicine

## 2018-11-19 ENCOUNTER — Observation Stay: Payer: Medicaid Other

## 2018-11-19 ENCOUNTER — Observation Stay
Admission: EM | Admit: 2018-11-19 | Discharge: 2018-11-20 | Disposition: A | Discharge status: Home / Self Care | Payer: Medicaid Other | Attending: Internal Medicine | Admitting: Internal Medicine

## 2018-11-19 ENCOUNTER — Other Ambulatory Visit: Payer: Self-pay

## 2018-11-19 DIAGNOSIS — K7460 Unspecified cirrhosis of liver: Secondary | ICD-10-CM

## 2018-11-19 DIAGNOSIS — F10239 Alcohol dependence with withdrawal, unspecified: Secondary | ICD-10-CM

## 2018-11-19 DIAGNOSIS — F10939 Alcohol use, unspecified with withdrawal, unspecified: Secondary | ICD-10-CM

## 2018-11-19 DIAGNOSIS — W19XXXA Unspecified fall, initial encounter: Secondary | ICD-10-CM

## 2018-11-19 DIAGNOSIS — R270 Ataxia, unspecified: Secondary | ICD-10-CM

## 2018-11-19 DIAGNOSIS — R410 Disorientation, unspecified: Secondary | ICD-10-CM

## 2018-11-19 DIAGNOSIS — Y92009 Unspecified place in unspecified non-institutional (private) residence as the place of occurrence of the external cause: Secondary | ICD-10-CM | POA: Diagnosis not present

## 2018-11-19 DIAGNOSIS — E512 Wernicke's encephalopathy: Secondary | ICD-10-CM

## 2018-11-19 HISTORY — DX: Reserved for inherently not codable concepts without codable children: IMO0001

## 2018-11-19 HISTORY — DX: Anxiety disorder, unspecified: F41.9

## 2018-11-19 HISTORY — DX: Alcohol dependence, uncomplicated: F10.20

## 2018-11-19 LAB — CBC WITH DIFFERENTIAL/PLATELET
Abs Immature Granulocytes: 0.01 10*3/uL (ref 0.00–0.07)
BASOS PCT: 1 %
Basophils Absolute: 0.1 10*3/uL (ref 0.0–0.1)
Eosinophils Absolute: 0.1 10*3/uL (ref 0.0–0.5)
Eosinophils Relative: 1 %
HCT: 48.1 % (ref 39.0–52.0)
Hemoglobin: 15.9 g/dL (ref 13.0–17.0)
Immature Granulocytes: 0 %
Lymphocytes Relative: 45 %
Lymphs Abs: 3.2 10*3/uL (ref 0.7–4.0)
MCH: 35.2 pg — ABNORMAL HIGH (ref 26.0–34.0)
MCHC: 33.1 g/dL (ref 30.0–36.0)
MCV: 106.4 fL — ABNORMAL HIGH (ref 80.0–100.0)
Monocytes Absolute: 0.6 10*3/uL (ref 0.1–1.0)
Monocytes Relative: 9 %
Neutro Abs: 3.1 10*3/uL (ref 1.7–7.7)
Neutrophils Relative %: 44 %
Platelets: 142 10*3/uL — ABNORMAL LOW (ref 150–400)
RBC: 4.52 MIL/uL (ref 4.22–5.81)
RDW: 12.4 % (ref 11.5–15.5)
WBC: 7.1 10*3/uL (ref 4.0–10.5)
nRBC: 0 % (ref 0.0–0.2)

## 2018-11-19 LAB — COMPREHENSIVE METABOLIC PANEL
ALT: 15 U/L (ref 0–44)
AST: 33 U/L (ref 15–41)
Albumin: 3.9 g/dL (ref 3.5–5.0)
Alkaline Phosphatase: 45 U/L (ref 38–126)
Anion gap: 9 (ref 5–15)
BUN: 6 mg/dL (ref 6–20)
CALCIUM: 9.3 mg/dL (ref 8.9–10.3)
CO2: 28 mmol/L (ref 22–32)
CREATININE: 0.49 mg/dL — AB (ref 0.61–1.24)
Chloride: 103 mmol/L (ref 98–111)
GFR calc Af Amer: >60 mL/min (ref >60)
Glucose, Bld: 90 mg/dL (ref 70–99)
Potassium: 3.3 mmol/L — ABNORMAL LOW (ref 3.5–5.1)
Sodium: 140 mmol/L (ref 135–145)
TOTAL PROTEIN: 8.4 g/dL — AB (ref 6.5–8.1)
Total Bilirubin: 0.9 mg/dL (ref 0.3–1.2)

## 2018-11-19 LAB — URINALYSIS, COMPLETE (UACMP) WITH MICROSCOPIC
Bacteria, UA: NONE SEEN
Bilirubin Urine: NEGATIVE
GLUCOSE, UA: NEGATIVE mg/dL
Hgb urine dipstick: NEGATIVE
Ketones, ur: NEGATIVE mg/dL
Leukocytes, UA: NEGATIVE
Nitrite: NEGATIVE
PH: 5 (ref 5.0–8.0)
PROTEIN: NEGATIVE mg/dL
SPECIFIC GRAVITY, URINE: 1.02 (ref 1.005–1.030)

## 2018-11-19 LAB — ETHANOL: Alcohol, Ethyl (B): <10 mg/dL (ref <10)

## 2018-11-19 LAB — AMMONIA: Ammonia: 50 umol/L — ABNORMAL HIGH (ref 9–35)

## 2018-11-19 LAB — VITAMIN B12: Vitamin B-12: 305 pg/mL (ref 180–914)

## 2018-11-19 LAB — MAGNESIUM: Magnesium: 2.2 mg/dL (ref 1.7–2.4)

## 2018-11-19 LAB — URINE DRUG SCREEN, QUALITATIVE (ARMC ONLY)
Amphetamines, Ur Screen: NOT DETECTED
Barbiturates, Ur Screen: NOT DETECTED
Benzodiazepine, Ur Scrn: POSITIVE — AB
Cannabinoid 50 Ng, Ur ~~LOC~~: NOT DETECTED
Cocaine Metabolite,Ur ~~LOC~~: NOT DETECTED
MDMA (Ecstasy)Ur Screen: NOT DETECTED
Methadone Scn, Ur: NOT DETECTED
Opiate, Ur Screen: NOT DETECTED
Phencyclidine (PCP) Ur S: NOT DETECTED
TRICYCLIC, UR SCREEN: POSITIVE — AB

## 2018-11-19 LAB — PROTIME-INR
INR: 1.19
PROTHROMBIN TIME: 15 s (ref 11.4–15.2)

## 2018-11-19 LAB — APTT: aPTT: 36 seconds (ref 24–36)

## 2018-11-19 LAB — CK: Total CK: 33 U/L — ABNORMAL LOW (ref 49–397)

## 2018-11-19 LAB — PHOSPHORUS: Phosphorus: 4.7 mg/dL — ABNORMAL HIGH (ref 2.5–4.6)

## 2018-11-19 MED ORDER — ALPRAZOLAM 1 MG PO TABS
2.0000 mg | ORAL_TABLET | Freq: Four times a day (QID) | ORAL | Status: DC
  Administered 2018-11-19 – 2018-11-20 (×3): 2 mg via ORAL
  Filled 2018-11-19: qty 4
  Filled 2018-11-19 (×2): qty 2

## 2018-11-19 MED ORDER — MAGNESIUM SULFATE 2 GM/50ML IV SOLN
2.0000 g | Freq: Once | INTRAVENOUS | Status: AC
  Administered 2018-11-19: 2 g via INTRAVENOUS
  Filled 2018-11-19: qty 50

## 2018-11-19 MED ORDER — ADULT MULTIVITAMIN W/MINERALS CH
1.0000 | ORAL_TABLET | Freq: Every day | ORAL | Status: DC
  Administered 2018-11-19 – 2018-11-20 (×2): 1 via ORAL
  Filled 2018-11-19 (×3): qty 1

## 2018-11-19 MED ORDER — VITAMIN B-1 100 MG PO TABS
100.0000 mg | ORAL_TABLET | Freq: Every day | ORAL | Status: DC
  Administered 2018-11-19: 100 mg via ORAL
  Filled 2018-11-19 (×2): qty 1

## 2018-11-19 MED ORDER — ADULT MULTIVITAMIN W/MINERALS CH
1.0000 | ORAL_TABLET | Freq: Once | ORAL | Status: AC
  Administered 2018-11-19: 1 via ORAL
  Filled 2018-11-19: qty 1

## 2018-11-19 MED ORDER — DOCUSATE SODIUM 100 MG PO CAPS: 100.0000 mg | ORAL_CAPSULE | Freq: Two times a day (BID) | ORAL | Status: DC | PRN

## 2018-11-19 MED ORDER — ENSURE ENLIVE PO LIQD
237.0000 mL | Freq: Three times a day (TID) | ORAL | Status: DC
  Administered 2018-11-19 – 2018-11-20 (×2): 237 mL via ORAL

## 2018-11-19 MED ORDER — THIAMINE HCL 100 MG/ML IJ SOLN
100.0000 mg | Freq: Every day | INTRAMUSCULAR | Status: DC
  Administered 2018-11-19: 100 mg via INTRAVENOUS
  Filled 2018-11-19: qty 1
  Filled 2018-11-19: qty 2

## 2018-11-19 MED ORDER — LORAZEPAM 2 MG/ML IJ SOLN: 1.0000 mg | INTRAMUSCULAR | Status: DC | PRN

## 2018-11-19 MED ORDER — HEPARIN SODIUM (PORCINE) 5000 UNIT/ML IJ SOLN
5000.0000 [IU] | Freq: Three times a day (TID) | INTRAMUSCULAR | Status: DC
  Administered 2018-11-19 – 2018-11-20 (×3): 5000 [IU] via SUBCUTANEOUS
  Filled 2018-11-19 (×3): qty 1

## 2018-11-19 MED ORDER — THIAMINE HCL 100 MG/ML IJ SOLN
500.0000 mg | Freq: Once | INTRAMUSCULAR | Status: AC
  Administered 2018-11-19: 500 mg via INTRAVENOUS

## 2018-11-19 MED ORDER — LACTULOSE 10 GM/15ML PO SOLN
30.0000 g | Freq: Once | ORAL | Status: AC
  Administered 2018-11-19: 30 g via ORAL
  Filled 2018-11-19: qty 60

## 2018-11-19 MED ORDER — FOLIC ACID 1 MG PO TABS
1.0000 mg | ORAL_TABLET | Freq: Every day | ORAL | Status: DC
  Administered 2018-11-19: 1 mg via ORAL
  Filled 2018-11-19 (×2): qty 1

## 2018-11-19 MED ORDER — VITAMIN B-1 100 MG PO TABS
100.0000 mg | ORAL_TABLET | Freq: Every day | ORAL | Status: DC
  Administered 2018-11-19 – 2018-11-20 (×2): 100 mg via ORAL

## 2018-11-19 MED ORDER — LACTULOSE 10 GM/15ML PO SOLN
20.0000 g | Freq: Two times a day (BID) | ORAL | Status: DC
  Administered 2018-11-19 – 2018-11-20 (×2): 20 g via ORAL
  Filled 2018-11-19 (×3): qty 30

## 2018-11-19 MED ORDER — LORAZEPAM 1 MG PO TABS
1.0000 mg | ORAL_TABLET | ORAL | Status: DC | PRN
  Administered 2018-11-19: 1 mg via ORAL
  Filled 2018-11-19: qty 1

## 2018-11-19 MED ORDER — CHLORDIAZEPOXIDE HCL 25 MG PO CAPS
25.0000 mg | ORAL_CAPSULE | Freq: Once | ORAL | Status: AC
  Administered 2018-11-19: 25 mg via ORAL
  Filled 2018-11-19: qty 1

## 2018-11-19 MED ORDER — FOLIC ACID 1 MG PO TABS
1.0000 mg | ORAL_TABLET | Freq: Every day | ORAL | Status: DC
  Administered 2018-11-19 – 2018-11-20 (×2): 1 mg via ORAL

## 2018-11-19 MED ORDER — AMITRIPTYLINE HCL 25 MG PO TABS
150.0000 mg | ORAL_TABLET | Freq: Every day | ORAL | Status: DC
  Administered 2018-11-19: 150 mg via ORAL
  Filled 2018-11-19: qty 6

## 2018-11-19 NOTE — ED Notes (Signed)
Report to receiving nurse lanette. Will send someone down to get him

## 2018-11-19 NOTE — H&P (Addendum)
[employer] - [place] at [facility]   PATIENT NAME: Phillip ManuelJeffrey Dambrosio    MR#:  [id number]  DATE OF BIRTH:  05/12/1966  DATE OF ADMISSION:  11/19/2018  PRIMARY CARE PHYSICIAN: [facility]   REQUESTING/REFERRING PHYSICIAN: Rifenbark  CHIEF COMPLAINT:   Chief Complaint  Patient presents with  . Fall    HISTORY OF PRESENT ILLNESS: Phillip ManuelJeffrey Vanderhoff  is a 53 y.o. male with a known history of anxiety, chronic alcoholism-lives alone and drinks 3-4 drinks daily-had generalized weakness and fall and was admitted last month with rhabdomyolysis.  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  Since last discharge he was supposed to have home health refusing therapy at his home but that never was started. He again had a fall last night and could not get up so brought to the emergency room by his sister.  He denies any associated symptoms.  As per sisters he is slightly confused and his ammonia level was noted to be elevated.  PAST MEDICAL HISTORY:   Past Medical History:  Diagnosis Date  . Anxiety     PAST SURGICAL HISTORY:  Past Surgical History:  Procedure Laterality Date  . BACK SURGERY      SOCIAL HISTORY:  Social History   Tobacco Use  . Smoking status: Current Every Day Smoker    Types: Cigarettes  . Smokeless tobacco: Never Used  Substance Use Topics  . Alcohol use: Yes    FAMILY HISTORY:  Family History  Problem Relation Age of Onset  . Hypertension Mother     DRUG ALLERGIES: No Known Allergies  REVIEW OF SYSTEMS:   CONSTITUTIONAL: No fever, have fatigue or weakness.  EYES: No blurred or double vision.  EARS, NOSE, AND THROAT: No tinnitus or ear pain.  RESPIRATORY: No cough, shortness of breath, wheezing or hemoptysis.  CARDIOVASCULAR: No chest pain, orthopnea, edema.  GASTROINTESTINAL: No nausea, vomiting, diarrhea or abdominal pain.  GENITOURINARY: No dysuria,  hematuria.  ENDOCRINE: No polyuria, nocturia,  HEMATOLOGY: No anemia, easy bruising or bleeding SKIN: No rash or lesion. MUSCULOSKELETAL: No joint pain or arthritis.   NEUROLOGIC: No tingling, numbness, weakness.  PSYCHIATRY: No anxiety or depression.   MEDICATIONS AT HOME:  Prior to Admission medications   Medication Sig Start Date End Date Taking? Authorizing Provider  alprazolam Prudy Feeler(XANAX) 2 MG tablet Take 2 mg by mouth 4 (four) times daily.   Yes [provider]  folic acid (FOLVITE) 1 MG tablet Take 1 mg by mouth daily.   Yes [provider]  Vitamin D, Ergocalciferol, (DRISDOL) 1.25 MG (50000 UT) CAPS capsule Take 100,000 Units by mouth every 7 (seven) days.    Yes [provider]  amitriptyline (ELAVIL) 150 MG tablet Take 150 mg by mouth at bedtime.    [provider]  feeding supplement, ENSURE ENLIVE, (ENSURE ENLIVE) LIQD Take 237 mLs by mouth 3 (three) times daily between meals. 10/09/18   Ihor AustinPyreddy, Pavan, MD  thiamine (VITAMIN B-1) 100 MG tablet Take 100 mg by mouth daily.    [provider]      PHYSICAL EXAMINATION:   VITAL SIGNS: Blood pressure 99/67, pulse 92, temperature 98.7 F (37.1 C), temperature source Oral, resp. rate 16, height 5\' 7"  (1.702 m), weight 89.8 kg, SpO2 93 %.  GENERAL:  53 y.o.-year-old patient lying in the bed with no acute distress.  EYES: Pupils equal, round, reactive to light and accommodation. No scleral icterus.  Extraocular muscles intact.  HEENT: Head atraumatic, normocephalic. Oropharynx and nasopharynx clear.  NECK:  Supple, no jugular venous distention. No thyroid enlargement, no tenderness.  LUNGS: Normal breath sounds bilaterally, no wheezing, rales,rhonchi or crepitation. No use of accessory muscles of respiration.  CARDIOVASCULAR: S1, S2 normal. No murmurs, rubs, or gallops.  ABDOMEN: Soft, nontender, nondistended. Bowel sounds present. No organomegaly or mass.  EXTREMITIES: No pedal edema,  cyanosis, or clubbing.  NEUROLOGIC: Cranial nerves II through XII are intact. Muscle strength 4/5 in all extremities. Sensation intact. Gait not checked.  PSYCHIATRIC: The patient is alert and oriented x 3.  SKIN: No obvious rash, lesion, or ulcer.   LABORATORY PANEL:   CBC Recent Labs  Lab 11/19/18 0436  WBC 7.1  HGB 15.9  HCT 48.1  PLT 142*  MCV 106.4*  MCH 35.2*  MCHC 33.1  RDW 12.4  LYMPHSABS 3.2  MONOABS 0.6  EOSABS 0.1  BASOSABS 0.1   ------------------------------------------------------------------------------------------------------------------  Chemistries  Recent Labs  Lab 11/19/18 0436 11/19/18 1123  NA 140  --   K 3.3*  --   CL 103  --   CO2 28  --   GLUCOSE 90  --   BUN 6  --   CREATININE 0.49*  --   CALCIUM 9.3  --   MG  --  2.2  AST 33  --   ALT 15  --   ALKPHOS 45  --   BILITOT 0.9  --    ------------------------------------------------------------------------------------------------------------------ estimated creatinine clearance is 115.5 mL/min (A) (by C-G formula based on SCr of 0.49 mg/dL (L)). ------------------------------------------------------------------------------------------------------------------ No results for input(s): TSH, T4TOTAL, T3FREE, THYROIDAB in the last 72 hours.  Invalid input(s): FREET3   Coagulation profile Recent Labs  Lab 11/19/18 1123  INR 1.19   ------------------------------------------------------------------------------------------------------------------- No results for input(s): DDIMER in the last 72 hours. -------------------------------------------------------------------------------------------------------------------  Cardiac Enzymes No results for input(s): CKMB, TROPONINI, MYOGLOBIN in the last 168 hours.  Invalid input(s): CK ------------------------------------------------------------------------------------------------------------------ Invalid input(s):  POCBNP  ---------------------------------------------------------------------------------------------------------------  Urinalysis    Component Value Date/Time   COLORURINE YELLOW (A) 11/19/2018 0436   APPEARANCEUR CLEAR (A) 11/19/2018 0436   LABSPEC 1.020 11/19/2018 0436   PHURINE 5.0 11/19/2018 0436   GLUCOSEU NEGATIVE 11/19/2018 0436   HGBUR NEGATIVE 11/19/2018 0436   BILIRUBINUR NEGATIVE 11/19/2018 0436   KETONESUR NEGATIVE 11/19/2018 0436   PROTEINUR NEGATIVE 11/19/2018 0436   NITRITE NEGATIVE 11/19/2018 0436   LEUKOCYTESUR NEGATIVE 11/19/2018 0436     RADIOLOGY: [place] Abdomen Limited Ruq  Result Date: 11/19/2018 CLINICAL DATA:  History of cirrhosis EXAM: ULTRASOUND ABDOMEN LIMITED RIGHT UPPER QUADRANT COMPARISON:  07/25/2007 FINDINGS: Gallbladder: Gallbladder is well distended with multiple dependent small gallstones. No wall thickening or pericholecystic fluid is noted. Mild gallbladder sludge is seen. Common bile duct: Diameter: 3 mm Liver: Diffuse increased echogenicity is noted consistent with the given clinical history. Comparison with prior CT shows some evidence of fatty infiltration as well. Portal vein is patent on color Doppler imaging with normal direction of blood flow towards the liver. IMPRESSION: Diffuse increased echogenicity within the liver without focal mass. This corresponds with the patient's given clinical history. Cholelithiasis and gallbladder sludge without complicating factors. Electronically Signed   By: Alcide CleverMark  Lukens M.D.   On: 11/19/2018 09:39    EKG: Orders placed or performed during the hospital encounter of 10/05/18  . ED EKG  . ED EKG  . EKG 12-Lead  . EKG 12-Lead  . EKG 12-Lead  . EKG 12-Lead  IMPRESSION AND PLAN:  *Altered mental status Hepatic encephalopathy Patient is chronic alcoholic.  Ammonia level is slightly high. We will get right upper quadrant ultrasound to diagnosis on cirrhosis.  He does not have a formal diagnosis of liver  cirrhosis but as per sister he was told by his primary care that he has liver damage. We will give lactulose.  *Chronic alcoholism We will keep on CIWA protocol for any signs of withdrawal.  Currently there is no sign.  *Hypokalemia Replace and check magnesium.  *Frequent fall This could be secondary to alcohol induced neuropathy or ataxia. I will call neurology consult for further management as CT scan of the head was negative during last admission.  *Active smoking Counseled to quit smoking and offered nicotine patch, time spent 4 minutes.  All the records are reviewed and case discussed with ED provider. Management plans discussed with the patient, family and they are in agreement.  CODE STATUS: Full code.    Code Status Orders  (From admission, onward)         Start     Ordered   11/19/18 1114  Full code  Continuous     11/19/18 1113        Code Status History    Date Active Date Inactive Code Status Order ID Comments User Context   10/05/2018 1306 10/09/2018 1721 Full Code [id number]  Adrian Saran, MD Inpatient    Advance Directive Documentation     Most Recent Value  Type of Advance Directive  Healthcare Power of Attorney  Pre-existing out of facility DNR order (yellow form or pink MOST form)  -  "MOST" Form in Place?  -     Discussed with his 2 sisters in the room.  TOTAL TIME TAKING CARE OF THIS PATIENT: 45 minutes.    Altamese Dilling M.D on 11/19/2018   Between 7am to 6pm - Pager - [phone]  After 6pm go to [email or web address] - Social research officer, government  [facility]  Office  [phone]  CC: Primary care physician; [facility], [facility]   Note: This dictation was prepared with Dragon dictation along with smaller phrase technology. Any transcriptional errors that result from this process are unintentional.

## 2018-11-19 NOTE — ED Notes (Signed)
Assumed care of patient. Aox4, soft spoken denies pain/sob. Patient with ciwa score of 2. Mild anxiety. Labs drawn as ordered. Vss. Safety maintained. Will monitor.

## 2018-11-19 NOTE — Consult Note (Addendum)
Reason for Consult: Ataxia and confusion Referring Physician: Altamese DillingVachhani, Vaibhavkumar, MD  CC: Multiple falls  HPI: Phillip Juarez is an 53 y.o. male with past medical history of depression, anxiety, alcohol abuse, probable Wernicke's encephalopathy, ataxia with recurrent falls and PTSD presenting to the ED on 11/18/2018 with complaints of multiple falls.  Patient reports that he has had multiple falls since he was started on amitriptyline by his psychiatry for depression and insomnia.  Patient reports that he stopped the medication after taking it for 5 days and noticed that the falls had stopped.  He described falls as buckling of his knees and feeling of generalized weakness prior to fall.  He however was advised to resume the medication by his psychiatry who felt that he needed the medication for sleep, however when he took it again he began falling.  Patient reports that he has not been drinking lately, he however admits to not eating or drinking well at home since he lives by himself.  He sees Dr. 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  He was previously admitted to the hospital on 09/2018 for alcohol withdrawal and confusion, at that time he received thiamine and folic acid supplements.  CK was elevated at the time of admission secondary to fall and probable rhabdomyolysis he received IV fluid resuscitation and aggressive potassium and magnesium supplement with improvement in mental status noted.  During this admission he was noted to have elevated ammonia levels of 50 despite normal LFTs, CK 33, UDS positive for TCA and benzo, urinalysis negative for UTI platelets 142, white count normal, potassium 3.3, creatinine 0.49.  Due to concerns for possible alcohol withdrawal patient was given 5 mg of chlordiazepoxide with improvement in his tremors and fasciculations noted.  He was also given lactulose for elevated ammonia as well as 2 g of magnesium, 500 mg of IV thiamine, and  multivitamin.  His mentation seems to have improved as he appeared to be a good historian when examined.  He will be admitted for further monitoring and management.  Past Medical History:  Diagnosis Date  . Anxiety     Past Surgical History:  Procedure Laterality Date  . BACK SURGERY      Family History  Problem Relation Age of Onset  . Hypertension Mother     Social History:  reports that he has been smoking cigarettes. He has never used smokeless tobacco. He reports current alcohol use. He reports current drug use. Drug: Benzodiazepines.  No Known Allergies  Medications:  I have reviewed the patient's current medications. Prior to Admission: (Not in a hospital admission)  Scheduled: . alprazolam  2 mg Oral QID  . amitriptyline  150 mg Oral QHS  . feeding supplement (ENSURE ENLIVE)  237 mL Oral TID BM  . folic acid  1 mg Oral Daily  . folic acid  1 mg Oral Daily  . heparin  5,000 Units Subcutaneous Q8H  . lactulose  20 g Oral BID  . multivitamin with minerals  1 tablet Oral Daily  . thiamine  100 mg Oral Daily   Or  . thiamine  100 mg Intravenous Daily  . thiamine  100 mg Oral Daily    ROS: History obtained from the patient   General ROS: negative for - chills, fatigue, fever, night sweats, weight gain or weight loss Psychological ROS: negative for - behavioral disorder, hallucinations,  mood swings or suicidal ideation. Positive for memory difficulties, Ophthalmic ROS: negative for - blurry  vision, double vision, eye pain or loss of vision ENT ROS: negative for - epistaxis, nasal discharge, oral lesions, sore throat, tinnitus or vertigo Allergy and Immunology ROS: negative for - hives or itchy/watery eyes Hematological and Lymphatic ROS: negative for - bleeding problems, bruising or swollen lymph nodes Endocrine ROS: negative for - galactorrhea, hair pattern changes, polydipsia/polyuria or temperature intolerance Respiratory ROS: negative for - cough, hemoptysis,  shortness of breath or wheezing Cardiovascular ROS: negative for - chest pain, dyspnea on exertion, edema or irregular heartbeat Gastrointestinal ROS: negative for - abdominal pain, hematemesis, nausea/vomiting or stool incontinence. Positive for diarrhea Genito-Urinary ROS: negative for - dysuria, hematuria, incontinence or urinary frequency/urgency Musculoskeletal ROS: negative for - joint swelling or muscular weakness Neurological ROS: as noted in HPI Dermatological ROS: negative for rash and skin lesion changes. Positive for multiple bruising  Physical Examination: Blood pressure 121/86, pulse 96, temperature 98.7 F (37.1 C), temperature source Oral, height 5\' 7"  (1.702 m), weight 77.1 kg, SpO2 97 %.  HEENT-  Normocephalic, scalp bruising, without obvious abnormality.  Normal external eye and conjunctiva.  Normal TM's bilaterally.  Normal auditory canals and external ears. Normal external nose, mucus membranes and septum.  Normal pharynx. Cardiovascular- S1, S2 normal, pulses palpable throughout   Lungs- chest clear, no wheezing, rales, normal symmetric air entry Abdomen- soft, non-tender; bowel sounds normal; no masses,  no organomegaly Extremities- no edema Lymph-no adenopathy palpable Musculoskeletal-no joint tenderness, deformity or swelling Skin-warm and dry, no hyperpigmentation, vitiligo, or suspicious lesions  Neurological Exam   Mental Status: Alert, oriented, thought content appropriate.  Speech fluent without evidence of aphasia.  Able to follow 3 step commands without difficulty. Attention span and concentration seemed appropriate  Cranial Nerves: II: Discs flat bilaterally; Visual fields grossly normal, pupils equal, round, reactive to light and accommodation III,IV, VI: ptosis not present, extra-ocular motions intact bilaterally V,VII: smile symmetric, facial light touch sensation intact VIII: hearing normal bilaterally IX,X: gag reflex present XI: bilateral shoulder  shrug XII: midline tongue extension Motor: Right :  Upper extremity   5/5 Without pronator drift      Left: Upper extremity   5/5 without pronator drift Right:   Lower extremity   5/5                                          Left: Lower extremity   5/5 Tone and bulk:normal tone throughout; no atrophy noted Sensory: Pinprick and light touch intact bilaterally Deep Tendon Reflexes: Absent throughout Plantars: Right: upgoing                              Left: upgoing Cerebellar: Finger-to-nose testing intact bilaterally. Heel to shin testing normal bilaterally Gait: slow and wide based  Data Reviewed  Laboratory Studies:   Basic Metabolic Panel: Recent Labs  Lab 11/19/18 0436  NA 140  K 3.3*  CL 103  CO2 28  GLUCOSE 90  BUN 6  CREATININE 0.49*  CALCIUM 9.3    Liver Function Tests: Recent Labs  Lab 11/19/18 0436  AST 33  ALT 15  ALKPHOS 45  BILITOT 0.9  PROT 8.4*  ALBUMIN 3.9   No results for input(s): LIPASE, AMYLASE in the last 168 hours. Recent Labs  Lab 11/19/18 0457  AMMONIA 50*    CBC: Recent Labs  Lab 11/19/18 0436  WBC 7.1  NEUTROABS 3.1  HGB 15.9  HCT 48.1  MCV 106.4*  PLT 142*    Cardiac Enzymes: Recent Labs  Lab 11/19/18 0436  CKTOTAL 33*    BNP: Invalid input(s): POCBNP  CBG: No results for input(s): GLUCAP in the last 168 hours.  Microbiology: Results for orders placed or performed during the hospital encounter of 10/05/18  Urine culture     Status: None   Collection Time: 10/05/18 11:39 AM  Result Value Ref Range Status   Specimen Description   Final    URINE, RANDOM Performed at [facility], [address], [place], [place] [address]    Special Requests   Final    NONE Performed at [facility], [address], [place], [place] [address]    Culture   Final    NO GROWTH Performed at [facility], [address], [place], [place] [address]    Report Status 10/06/2018 FINAL  Final     Coagulation Studies: No results for input(s): LABPROT, INR in the last 72 hours.  Urinalysis:  Recent Labs  Lab 11/19/18 0436  COLORURINE YELLOW*  LABSPEC 1.020  PHURINE 5.0  GLUCOSEU NEGATIVE  HGBUR NEGATIVE  BILIRUBINUR NEGATIVE  KETONESUR NEGATIVE  PROTEINUR NEGATIVE  NITRITE NEGATIVE  LEUKOCYTESUR NEGATIVE    Lipid Panel:  No results found for: CHOL, TRIG, HDL, CHOLHDL, VLDL, LDLCALC  HgbA1C: No results found for: HGBA1C  Urine Drug Screen:      Component Value Date/Time   LABOPIA NONE DETECTED 11/19/2018 0436   COCAINSCRNUR NONE DETECTED 11/19/2018 0436   LABBENZ POSITIVE (A) 11/19/2018 0436   AMPHETMU NONE DETECTED 11/19/2018 0436   THCU NONE DETECTED 11/19/2018 0436   LABBARB NONE DETECTED 11/19/2018 0436    Alcohol Level: No results for input(s): ETH in the last 168 hours.  Other results: EKG: there are no previous tracings available for comparison.  Imaging: [place] Abdomen Limited Ruq  Result Date: 11/19/2018 CLINICAL DATA:  History of cirrhosis EXAM: ULTRASOUND ABDOMEN LIMITED RIGHT UPPER QUADRANT COMPARISON:  07/25/2007 FINDINGS: Gallbladder: Gallbladder is well distended with multiple dependent small gallstones. No wall thickening or pericholecystic fluid is noted. Mild gallbladder sludge is seen. Common bile duct: Diameter: 3 mm Liver: Diffuse increased echogenicity is noted consistent with the given clinical history. Comparison with prior CT shows some evidence of fatty infiltration as well. Portal vein is patent on color Doppler imaging with normal direction of blood flow towards the liver. IMPRESSION: Diffuse increased echogenicity within the liver without focal mass. This corresponds with the patient's given clinical history. Cholelithiasis and gallbladder sludge without complicating factors. Electronically Signed   By: Alcide Clever M.D.   On: 11/19/2018 09:39    Patient seen and examined.  Clinical course and management discussed.  Necessary edits  performed.  I agree with the above.  Assessment and plan of care developed and discussed below.    Assessment: 53 y.o male with past medical history of depression, anxiety, alcohol abuse, probable Wernicke's encephalopathy, ataxia with recurrent falls and PTSD presenting to the ED on 11/18/2018 with complaints of multiple falls.  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 He has had work-up in the past including MRI brain done on 05/2018 which showed no abnormality and EEG done on 09/2018 with no seizure or seizure predisposition recorded. Current labs revealed elevated ammonia levels of 50 despite normal LFTs, CK 33,  UDS positive for TCA and benzo, urinalysis negative for UTI, platelets 142, white blood cell count normal, potassium 3.3, creatinine 0.49. Mentation improved following replacement of thiamine, magnesium and lactulose administration for elevated ammonia.  Plan 1. Check Vitamin B12, TSH 2. Thiamine level pending 3. Thiamine replacement 4. Continue discontinuation of TCA's 5. Agree with current medical management  This patient was staffed with Dr. Verlon AuLeslie, Thad Rangereynolds who personally evaluated patient, reviewed documentation and agreed with assessment and plan of care as above.  Webb SilversmithElizabeth Ouma, DNP, FNP-BC Board certified Nurse Practitioner Neurology Department  11/19/2018, 10:11 AM   Thana FarrLeslie Caci Orren, MD Neurology [phone]  11/19/2018  6:33 PM

## 2018-11-19 NOTE — ED Notes (Signed)
Pt resting with eyes closed, bed locked and low. VSS.

## 2018-11-19 NOTE — ED Notes (Signed)
Pt resting with no complaints, lights off, no distress noted.

## 2018-11-19 NOTE — ED Notes (Signed)
Scheduled meds given. Patient reports having large amount of diarrhea this morning. Scheduled lactulose not given at 11am, as per endorsing nurse patient received dose this morning at 0600 with positive effects .

## 2018-11-19 NOTE — ED Notes (Signed)
Pt resting with eyes closed, no distress noted, rise and fall of chest noted.

## 2018-11-19 NOTE — ED Provider Notes (Signed)
[facility] Emergency Department Provider Note  ____________________________________________   First MD Initiated Contact with Patient 11/19/18 [phone]     (approximate)  I have reviewed the triage vital signs and the nursing notes.   HISTORY  Chief Complaint Fall   HPI Phillip Juarez is a 53 y.o. male who self presents to the emergency department with increasing falls recently.  The patient lives alone and for the past week or so has fallen multiple times every day and has had difficulty caring for himself.  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  He thinks that his symptoms were worsened by beginning amitriptyline and he recently stopped taking them.  He continues to drink alcohol and only drinks hard liquor saying that he quit drinking beer "years ago".  His symptoms have been gradual onset slowly progressive are now moderate to severe.  He has pain in both knees and finds it difficult to ambulate.  According to family the patient has been increasingly confused.  He denies visual changes or difficulty seeing.    Past Medical History:  Diagnosis Date  . Anxiety     Patient Active Problem List   Diagnosis Date Noted  . Fall at home, initial encounter 11/19/2018  . Confusion 11/19/2018  . Altered behavior 10/05/2018    Past Surgical History:  Procedure Laterality Date  . BACK SURGERY      Prior to Admission medications   Medication Sig Start Date End Date Taking? Authorizing Provider  alprazolam Prudy Feeler) 2 MG tablet Take 2 mg by mouth 4 (four) times daily.   Yes [provider]  folic acid (FOLVITE) 1 MG tablet Take 1 mg by mouth daily.   Yes [provider]  Vitamin D, Ergocalciferol, (DRISDOL) 1.25 MG (50000 UT) CAPS capsule Take 100,000 Units by mouth every 7 (seven) days.    Yes [provider]  amitriptyline (ELAVIL) 150 MG tablet Take 150  mg by mouth at bedtime.    [provider]  feeding supplement, ENSURE ENLIVE, (ENSURE ENLIVE) LIQD Take 237 mLs by mouth 3 (three) times daily between meals. 10/09/18   Ihor Austin, MD  thiamine (VITAMIN B-1) 100 MG tablet Take 100 mg by mouth daily.    [provider]    Allergies Patient has no known allergies.  Family History  Problem Relation Age of Onset  . Hypertension Mother     Social History Social History   Tobacco Use  . Smoking status: Current Every Day Smoker    Types: Cigarettes  . Smokeless tobacco: Never Used  Substance Use Topics  . Alcohol use: Yes  . Drug use: Yes    Types: Benzodiazepines    Review of Systems Constitutional: No fever/chills Eyes: No visual changes. ENT: No sore throat. Cardiovascular: Denies chest pain. Respiratory: Denies shortness of breath. Gastrointestinal: No abdominal pain.  Positive for nausea, no vomiting.  No diarrhea.  No constipation. Genitourinary: Negative for dysuria. Musculoskeletal: Positive for bilateral knee pain Skin: Negative for rash. Neurological: Negative for headaches, focal weakness or numbness.   ____________________________________________   PHYSICAL EXAM:  VITAL SIGNS: ED Triage Vitals  Enc Vitals Group     BP 11/18/18 2333 126/75     Pulse Rate 11/18/18 2333 (!) 113     Resp --      Temp 11/18/18 2333 98.7 F (37.1 C)     Temp Source 11/18/18 2333 Oral  SpO2 11/18/18 2333 98 %     Weight 11/18/18 2315 170 lb (77.1 kg)     Height 11/18/18 2315 5\' 7"  (1.702 m)     Head Circumference --      Peak Flow --      Pain Score 11/18/18 2315 10     Pain Loc --      Pain Edu? --      Excl. in GC? --     Constitutional: The patient is quite shaky and unsteady on his feet.  He is actually alert and oriented x4 right now but speaks with slow methodical sentences Eyes: PERRL EOMI. midrange and brisk.  No nystagmus appreciated Head: Atraumatic. Nose: No  congestion/rhinnorhea. Mouth/Throat: No trismus he does have some tongue fasciculations Neck: No stridor.   Cardiovascular: Tachycardic rate, regular rhythm. Grossly normal heart sounds.  Good peripheral circulation. Respiratory: Normal respiratory effort.  No retractions. Lungs CTAB and moving good air Gastrointestinal: Soft nontender.  I do appreciate a liver edge 2 cm below the right costal margin Musculoskeletal: No lower extremity edema positive for hand tremors Neurologic: Grossly exquisitely ataxic he has 4+ out of 5 strength in all extremities.  Abnormal finger-nose-finger bilaterally Skin:  Skin is warm, dry and intact. No rash noted. Psychiatric: Mood and affect are normal. Speech and behavior are normal.    ____________________________________________   DIFFERENTIAL includes but not limited to  Wernicke's encephalopathy, dehydration, metabolic derangement, hyperammonemia ____________________________________________   LABS (all labs ordered are listed, but only abnormal results are displayed)  Labs Reviewed  URINALYSIS, COMPLETE (UACMP) WITH MICROSCOPIC - Abnormal; Notable for the following components:      Result Value   Color, Urine YELLOW (*)    APPearance CLEAR (*)    All other components within normal limits  COMPREHENSIVE METABOLIC PANEL - Abnormal; Notable for the following components:   Potassium 3.3 (*)    Creatinine, Ser 0.49 (*)    Total Protein 8.4 (*)    All other components within normal limits  CBC WITH DIFFERENTIAL/PLATELET - Abnormal; Notable for the following components:   MCV 106.4 (*)    MCH 35.2 (*)    Platelets 142 (*)    All other components within normal limits  AMMONIA - Abnormal; Notable for the following components:   Ammonia 50 (*)    All other components within normal limits  URINE DRUG SCREEN, QUALITATIVE ([facility] ONLY) - Abnormal; Notable for the following components:   Tricyclic, Ur Screen POSITIVE (*)    Benzodiazepine, Ur Scrn  POSITIVE (*)    All other components within normal limits  CK - Abnormal; Notable for the following components:   Total CK 33 (*)    All other components within normal limits  VITAMIN B1  PROTIME-INR  APTT  ETHANOL  MAGNESIUM  PHOSPHORUS    Lab work reviewed by me with a number of abnormalities most notably an MCV of 106 along with hyperammonemia __________________________________________  EKG   ____________________________________________  RADIOLOGY   ____________________________________________   PROCEDURES  Procedure(s) performed: no  Procedures  Critical Care performed: no  ____________________________________________   INITIAL IMPRESSION / ASSESSMENT AND PLAN / ED COURSE  Pertinent labs & imaging results that were available during my care of the patient were reviewed by me and considered in my medical decision making (see chart for details).   As part of my medical decision making, I reviewed the following data within the electronic MEDICAL RECORD NUMBER History obtained from family if available, nursing notes,  old chart and ekg, as well as notes from prior ED visits.  The patient comes to the emergency department grossly ataxic and unable to ambulate or care for himself.  He continues to abuse alcohol regularly and after his lengthy wait in the waiting room he seems to be withdrawing from alcohol right now.  I have given him 25 mg of chlordiazepoxide with improvement in his tremors and fasciculations.  My concern regarding his alcohol abuse and ataxia is that he may be developing Wernicke's encephalopathy as he is ataxic along with confusion.  He does not have ophthalmoplegia, however the full triad is actually atypical.  Interestingly his ammonia is quite high despite normal LFTs and I am not entirely sure what to make of this.  We will give him a dose of lactulose to see if that helps his mental status as well as 2 g of magnesium, 500 mg of IV thiamine, multivitamin, he  will require inpatient admission.  I have sent off a thiamine level and discussed with the hospitalist Dr. Helayne Seminole who has graciously agreed to admit the patient to his service.  The patient verbalizes understanding and agreement with the plan.      ____________________________________________   FINAL CLINICAL IMPRESSION(S) / ED DIAGNOSES  Final diagnoses:  Wernicke's encephalopathy  Ataxia  Alcohol withdrawal syndrome with complication (HCC)      NEW MEDICATIONS STARTED DURING THIS VISIT:  New Prescriptions   No medications on file     Note:  This document was prepared using Dragon voice recognition software and may include unintentional dictation errors.    Merrily Brittle, MD 11/19/18 [phone]

## 2018-11-19 NOTE — ED Notes (Signed)
Pt assisted to bsc with one assist. Fairly stable, pt states he has diarrhea.

## 2018-11-19 NOTE — ED Notes (Signed)
Patient sleeping

## 2018-11-19 NOTE — ED Notes (Signed)
lunch box provided, patient tolerated well. ciwa score 3

## 2018-11-19 NOTE — ED Notes (Signed)
ED TO INPATIENT HANDOFF REPORT  Name/Age/Gender Phillip SlimJeffrey W Juarez 53 y.o. male  Code Status    Code Status Orders  (From admission, onward)         Start     Ordered   11/19/18 1114  Full code  Continuous     11/19/18 1113        Code Status History    Date Active Date Inactive Code Status Order ID Comments User Context   10/05/2018 1306 10/09/2018 1721 Full Code [id number]  Adrian SaranMody, Sital, MD Inpatient    Advance Directive Documentation     Most Recent Value  Type of Advance Directive  Healthcare Power of Attorney  Pre-existing out of facility DNR order (yellow form or pink MOST form)  -  "MOST" Form in Place?  -      Home/SNF/Other  From home   Chief Complaint Multiple Falls   Level of Care/Admitting Diagnosis ED Disposition    ED Disposition Condition Comment   Admit  Hospital Area: [facility] [[id number]]  Level of Care: Med-Surg [16]  Diagnosis: Confusion [193061]  Admitting Physician: Altamese DillingVACHHANI, VAIBHAVKUMAR [[id number]]  Attending Physician: Altamese DillingVACHHANI, VAIBHAVKUMAR [phone][[id number]]  PT Class (Do Not Modify): Observation [104]  PT Acc Code (Do Not Modify): Observation [10022]       Medical History Past Medical History:  Diagnosis Date  . Anxiety     Allergies No Known Allergies  IV Location/Drains/Wounds Patient Lines/Drains/Airways Status   Active Line/Drains/Airways    Name:   Placement date:   Placement time:   Site:   Days:   Peripheral IV 11/19/18 Left Forearm   11/19/18    0543    Forearm   less than 1          Labs/Imaging Results for orders placed or performed during the hospital encounter of 11/19/18 (from the past 48 hour(s))  Urinalysis, Complete w Microscopic     Status: Abnormal   Collection Time: 11/19/18  4:36 AM  Result Value Ref Range   Color, Urine YELLOW (A) YELLOW   APPearance CLEAR (A) CLEAR   Specific Gravity, Urine 1.020 1.005 - 1.030   pH 5.0 5.0 - 8.0   Glucose, UA NEGATIVE NEGATIVE mg/dL   Hgb urine  dipstick NEGATIVE NEGATIVE   Bilirubin Urine NEGATIVE NEGATIVE   Ketones, ur NEGATIVE NEGATIVE mg/dL   Protein, ur NEGATIVE NEGATIVE mg/dL   Nitrite NEGATIVE NEGATIVE   Leukocytes, UA NEGATIVE NEGATIVE   RBC / HPF 0-5 0 - 5 RBC/hpf   WBC, UA 0-5 0 - 5 WBC/hpf   Bacteria, UA NONE SEEN NONE SEEN   Squamous Epithelial / LPF 0-5 0 - 5   Mucus PRESENT     Comment: Performed at [facility], [address] [place], [place] [address]  Comprehensive metabolic panel     Status: Abnormal   Collection Time: 11/19/18  4:36 AM  Result Value Ref Range   Sodium 140 135 - 145 mmol/L   Potassium 3.3 (L) 3.5 - 5.1 mmol/L   Chloride 103 98 - 111 mmol/L   CO2 28 22 - 32 mmol/L   Glucose, Bld 90 70 - 99 mg/dL   BUN 6 6 - 20 mg/dL   Creatinine, Ser 8.460.49 (L) 0.61 - 1.24 mg/dL   Calcium 9.3 8.9 - 96.210.3 mg/dL   Total Protein 8.4 (H) 6.5 - 8.1 g/dL   Albumin 3.9 3.5 - 5.0 g/dL   AST 33 15 - 41 U/L   ALT 15 0 -  44 U/L   Alkaline Phosphatase 45 38 - 126 U/L   Total Bilirubin 0.9 0.3 - 1.2 mg/dL   GFR calc non Af Amer >60 >60 mL/min   GFR calc Af Amer >60 >60 mL/min   Anion gap 9 5 - 15    Comment: Performed at [facility], [address] [place], [place] [address]  CBC with Differential     Status: Abnormal   Collection Time: 11/19/18  4:36 AM  Result Value Ref Range   WBC 7.1 4.0 - 10.5 K/uL   RBC 4.52 4.22 - 5.81 MIL/uL   Hemoglobin 15.9 13.0 - 17.0 g/dL   HCT 23.3 61.2 - 24.4 %   MCV 106.4 (H) 80.0 - 100.0 fL   MCH 35.2 (H) 26.0 - 34.0 pg   MCHC 33.1 30.0 - 36.0 g/dL   RDW 97.5 30.0 - 51.1 %   Platelets 142 (L) 150 - 400 K/uL   nRBC 0.0 0.0 - 0.2 %   Neutrophils Relative % 44 %   Neutro Abs 3.1 1.7 - 7.7 K/uL   Lymphocytes Relative 45 %   Lymphs Abs 3.2 0.7 - 4.0 K/uL   Monocytes Relative 9 %   Monocytes Absolute 0.6 0.1 - 1.0 K/uL   Eosinophils Relative 1 %   Eosinophils Absolute 0.1 0.0 - 0.5 K/uL   Basophils Relative 1 %   Basophils Absolute 0.1 0.0 - 0.1 K/uL    Immature Granulocytes 0 %   Abs Immature Granulocytes 0.01 0.00 - 0.07 K/uL    Comment: Performed at [facility], [address], [place], [place] [address]  Urine Drug Screen, Qualitative     Status: Abnormal   Collection Time: 11/19/18  4:36 AM  Result Value Ref Range   Tricyclic, Ur Screen POSITIVE (A) NONE DETECTED   Amphetamines, Ur Screen NONE DETECTED NONE DETECTED   MDMA (Ecstasy)Ur Screen NONE DETECTED NONE DETECTED   Cocaine Metabolite,Ur [place] NONE DETECTED NONE DETECTED   Opiate, Ur Screen NONE DETECTED NONE DETECTED   Phencyclidine (PCP) Ur S NONE DETECTED NONE DETECTED   Cannabinoid 50 Ng, Ur [place] NONE DETECTED NONE DETECTED   Barbiturates, Ur Screen NONE DETECTED NONE DETECTED   Benzodiazepine, Ur Scrn POSITIVE (A) NONE DETECTED   Methadone Scn, Ur NONE DETECTED NONE DETECTED    Comment: (NOTE) Tricyclics + metabolites, urine    Cutoff 1000 ng/mL Amphetamines + metabolites, urine  Cutoff 1000 ng/mL MDMA (Ecstasy), urine              Cutoff 500 ng/mL Cocaine Metabolite, urine          Cutoff 300 ng/mL Opiate + metabolites, urine        Cutoff 300 ng/mL Phencyclidine (PCP), urine         Cutoff 25 ng/mL Cannabinoid, urine                 Cutoff 50 ng/mL Barbiturates + metabolites, urine  Cutoff 200 ng/mL Benzodiazepine, urine              Cutoff 200 ng/mL Methadone, urine                   Cutoff 300 ng/mL The urine drug screen provides only a preliminary, unconfirmed analytical test result and should not be used for non-medical purposes. Clinical consideration and professional judgment should be applied to any positive drug screen result due to possible interfering substances. A more specific alternate chemical method must be used in order  to obtain a confirmed analytical result. Gas chromatography / mass spectrometry (GC/MS) is the preferred confirmat ory method. Performed at [facility], [address] [place], [place] [address]   CK      Status: Abnormal   Collection Time: 11/19/18  4:36 AM  Result Value Ref Range   Total CK 33 (L) 49 - 397 U/L    Comment: Performed at [facility], [address] [place], [place] [address]  Ammonia     Status: Abnormal   Collection Time: 11/19/18  4:57 AM  Result Value Ref Range   Ammonia 50 (H) 9 - 35 umol/L    Comment: Performed at [facility], [address] [place], [place] [address]  Protime-INR     Status: None   Collection Time: 11/19/18 11:23 AM  Result Value Ref Range   Prothrombin Time 15.0 11.4 - 15.2 seconds   INR 1.19     Comment: Performed at [facility], [address] [place], [place] [address]  APTT     Status: None   Collection Time: 11/19/18 11:23 AM  Result Value Ref Range   aPTT 36 24 - 36 seconds    Comment: Performed at [facility], [address] [place], [place] [address]  Ethanol     Status: None   Collection Time: 11/19/18 11:23 AM  Result Value Ref Range   Alcohol, Ethyl (B) <10 <10 mg/dL    Comment: (NOTE) Lowest detectable limit for serum alcohol is 10 mg/dL. For medical purposes only. Performed at [facility], [address] [place], [place] [address]   Magnesium     Status: None   Collection Time: 11/19/18 11:23 AM  Result Value Ref Range   Magnesium 2.2 1.7 - 2.4 mg/dL    Comment: Performed at [facility] [address] [place], [place] [address]  Phosphorus     Status: Abnormal   Collection Time: 11/19/18 11:23 AM  Result Value Ref Range   Phosphorus 4.7 (H) 2.5 - 4.6 mg/dL    Comment: Performed at [facility], [address] [place], [place] [address]  Vitamin B12     Status: None   Collection Time: 11/19/18 11:23 AM  Result Value Ref Range   Vitamin B-12 305 180 - 914 pg/mL    Comment: (NOTE) This assay is not validated for testing neonatal or myeloproliferative syndrome specimens for Vitamin B12 levels. Performed at [facility], [address] [place], [place] [address]    US Abdomen Limited Ruq  Result Date: 11/19/2018 CLINICAL DATA:  History of cirrhosis EXAM: ULTRASOUND ABDOMEN LIMITED RIGHT UPPER QUADRANT COMPARISON:  07/25/2007 FINDINGS: Gallbladder: Gallbladder is well distended with multiple dependent small gallstones. No wall thickening or pericholecystic fluid is noted. Mild gallbladder sludge is seen. Common bile duct: Diameter: 3 mm Liver: Diffuse increased echogenicity is noted consistent with the given clinical history. Comparison with prior CT shows some evidence of fatty infiltration as well. Portal vein is patent on color Doppler imaging with normal direction of blood flow towards the liver. IMPRESSION: Diffuse increased echogenicity within the liver without focal mass. This corresponds with the patient's given clinical history. Cholelithiasis and gallbladder sludge without complicating factors. Electronically Signed   By: Alcide Clever M.D.   On: 11/19/2018 09:39    Pending Labs Unresulted Labs (From admission, onward)    Start     Ordered   11/20/18 0500  Basic metabolic panel  Tomorrow morning,  STAT     11/19/18 1113   11/20/18 0500  CBC  Tomorrow morning,   STAT     11/19/18 1113   11/20/18 0500  Ammonia  Tomorrow morning,   STAT     11/19/18 0841   11/19/18 1114  HIV antibody (Routine Testing)  Once,   STAT     11/19/18 1113   11/19/18 0537  Vitamin B1  Once,   STAT     11/19/18 0537          Vitals/Pain Today's Vitals   11/19/18 1715 11/19/18 1730 11/19/18 1745 11/19/18 1800  BP:  112/78  99/72  Pulse: 88 88 90 91  Resp:      Temp:      TempSrc:      SpO2: 94% 95% 95% 93%  Weight:      Height:      PainSc:        Isolation Precautions No active isolations  Medications Medications  ALPRAZolam (XANAX) tablet 2 mg (2 mg Oral Not Given 11/19/18 1528)  amitriptyline (ELAVIL) tablet 150 mg (has no administration in time range)  folic acid (FOLVITE) tablet 1 mg (1 mg Oral Given 11/19/18 1144)   feeding supplement (ENSURE ENLIVE) (ENSURE ENLIVE) liquid 237 mL (has no administration in time range)  thiamine (VITAMIN B-1) tablet 100 mg (100 mg Oral Given 11/19/18 1145)  LORazepam (ATIVAN) tablet 1 mg (1 mg Oral Given 11/19/18 1525)    Or  LORazepam (ATIVAN) injection 1 mg ( Intravenous See Alternative 11/19/18 1525)  thiamine (VITAMIN B-1) tablet 100 mg (100 mg Oral Given 11/19/18 1144)    Or  thiamine (B-1) injection 100 mg ( Intravenous See Alternative 11/19/18 1144)  folic acid (FOLVITE) tablet 1 mg (1 mg Oral Given 11/19/18 1144)  multivitamin with minerals tablet 1 tablet (1 tablet Oral Given 11/19/18 1142)  docusate sodium (COLACE) capsule 100 mg (has no administration in time range)  heparin injection 5,000 Units (5,000 Units Subcutaneous Given 11/19/18 1527)  lactulose (CHRONULAC) 10 GM/15ML solution 20 g (20 g Oral Not Given 11/19/18 1049)  thiamine (B-1) injection 500 mg (500 mg Intravenous Given 11/19/18 1151)  multivitamin with minerals tablet 1 tablet (1 tablet Oral Given 11/19/18 0552)  magnesium sulfate IVPB 2 g 50 mL (0 g Intravenous Stopped 11/19/18 0645)  chlordiazePOXIDE (LIBRIUM) capsule 25 mg (25 mg Oral Given 11/19/18 0553)  lactulose (CHRONULAC) 10 GM/15ML solution 30 g (30 g Oral Given 11/19/18 0600)    Mobility ambulatory

## 2018-11-19 NOTE — ED Notes (Signed)
Dinner tray provided. Patient tolerating well, denies n/v/d. Will continue to monitor.

## 2018-11-20 LAB — BASIC METABOLIC PANEL
Anion gap: 8 (ref 5–15)
BUN: 9 mg/dL (ref 6–20)
CHLORIDE: 105 mmol/L (ref 98–111)
CO2: 26 mmol/L (ref 22–32)
CREATININE: 0.38 mg/dL — AB (ref 0.61–1.24)
Calcium: 9 mg/dL (ref 8.9–10.3)
GFR calc Af Amer: >60 mL/min (ref >60)
GFR calc non Af Amer: >60 mL/min (ref >60)
Glucose, Bld: 96 mg/dL (ref 70–99)
Potassium: 3.6 mmol/L (ref 3.5–5.1)
Sodium: 139 mmol/L (ref 135–145)

## 2018-11-20 LAB — CBC
HCT: 42.2 % (ref 39.0–52.0)
Hemoglobin: 14 g/dL (ref 13.0–17.0)
MCH: 35.4 pg — ABNORMAL HIGH (ref 26.0–34.0)
MCHC: 33.2 g/dL (ref 30.0–36.0)
MCV: 106.8 fL — ABNORMAL HIGH (ref 80.0–100.0)
Platelets: 105 10*3/uL — ABNORMAL LOW (ref 150–400)
RBC: 3.95 MIL/uL — ABNORMAL LOW (ref 4.22–5.81)
RDW: 12 % (ref 11.5–15.5)
WBC: 5.1 10*3/uL (ref 4.0–10.5)
nRBC: 0 % (ref 0.0–0.2)

## 2018-11-20 LAB — HIV ANTIBODY (ROUTINE TESTING W REFLEX): HIV Screen 4th Generation wRfx: NONREACTIVE

## 2018-11-20 LAB — AMMONIA: Ammonia: 51 umol/L — ABNORMAL HIGH (ref 9–35)

## 2018-11-20 NOTE — Progress Notes (Signed)
Pt for discharge home/ sister called earlier to pick pt up.  Pt amb with  Walker  Around nurses station and tol well. Encouraged to use walker at home. Instructions discussed with pt  When sister leigh came to pick him up. No new presc. Home meds discussed. Verbalized understanding. Sl d/cd. No voiced c/o out via w/c.

## 2018-11-20 NOTE — Plan of Care (Signed)

## 2018-11-20 NOTE — Discharge Instructions (Signed)
Alcohol Abuse and Nutrition Alcohol abuse is any pattern of alcohol consumption that harms your health, relationships, or work. Alcohol abuse can cause poor nutrition (malnutrition or malnourishment) and a lack of nutrients (nutrient deficiencies), which can lead to more complications. Alcohol abuse brings malnutrition and nutrient deficiencies in two ways:  It causes your liver to work abnormally. This affects how your body divides (breaks down) and absorbs nutrients from food.  It causes you to eat poorly. Many people who abuse alcohol do not eat enough carbohydrates, protein, fat, vitamins, and minerals. Nutrients that are commonly lacking (deficient) in people who abuse alcohol include:  Vitamins. ? Vitamin A. This is needed for your vision, metabolism, and ability to fight off infections (immunity). ? B vitamins. These include folate, thiamine, and niacin. These are needed for new cell growth. ? Vitamin C. This plays an important role in wound healing, immunity, and helping your body to absorb iron. ? Vitamin D. This is necessary for your body to absorb and use calcium. It is produced by your liver, but you can also get it from food and from sun exposure.  Minerals. ? Calcium. This is needed for healthy bones as well as heart and blood vessel (cardiovascular) function. ? Iron. This is important for blood, muscle, and nervous system functioning. ? Magnesium. This plays an important role in muscle and nerve function, and it helps to control blood sugar and blood pressure. ? Zinc. This is important for the normal functioning of your nervous system and digestive system (gastrointestinal tract). If you think that you have an alcohol dependency problem, or if it is hard to stop drinking because you feel sick or different when you do not use alcohol, talk with your health care provider or another health professional about where to get help. Nutrition is an essential factor in therapy for alcohol  abuse. Your health care provider or diet and nutrition specialist (dietitian) will work with you to design a plan that can help to restore nutrients to your body and prevent the risk of complications. What is my plan? Your dietitian may develop a specific eating plan that is based on your condition and any other problems that you have. An eating plan will commonly include:  A balanced diet. ? Grains: 6-8 oz (170-227 g) a day. Examples of 1 oz of whole grains include 1 cup of whole-wheat cereal,  cup of brown rice, or 1 slice of whole-wheat bread. ? Vegetables: 2-3 cups a day. Examples of 1 cup of vegetables include 2 medium carrots, 1 large tomato, or 2 stalks of celery. ? Fruits: 1-2 cups a day. Examples of 1 cup of fruit include 1 large banana, 1 small apple, 8 large strawberries, or 1 large orange. ? Meat and other protein: 5-6 oz (142-170 g) a day.  A cut of meat or fish that is the size of a deck of cards is about 3-4 oz.  Foods that provide 1 oz of protein include 1 egg,  cup of nuts or seeds, or 1 tablespoon (16 g) of peanut butter. ? Dairy: 2-3 cups a day. Examples of 1 cup of dairy include 8 oz (230 mL) of milk, 8 oz (230 g) of yogurt, or 1 oz (44 g) of natural cheese.  Vitamin and mineral supplements. What are tips for following this plan?  Eat frequent meals and snacks. Try to eat 5-6 small meals each day.  Take vitamin or mineral supplements as recommended by your dietitian.  If you are malnourished  or if your dietitian recommends it: ? You may follow a high-protein, high-calorie diet. This may include:  2,000-3,000 calories (kilocalories) a day.  70-100 g (grams) of protein a day. ? You may be directed to follow a diet that includes a complete nutritional supplement beverage. This can help to restore calories, protein, and vitamins to your body. Depending on your condition, you may be advised to consume this beverage instead of your meals or in addition to them.  Certain  medicines may cause changes in your appetite, taste, and weight. Work with your health care provider and dietitian to make any changes to your medicines and eating plan.  If you are unable to take in enough food and calories by mouth, your health care provider may recommend a feeding tube. This tube delivers nutritional supplements directly to your stomach. Recommended foods  Eat foods that are high in molecules that prevent oxygen from reacting with your food (antioxidants). These foods include grapes, berries, nuts, green tea, and dark green or orange vegetables. Eating these can help to prevent some of the stress that is placed on your liver by consuming alcohol.  Eat a variety of fresh fruits and vegetables each day. This will help you to get fiber and vitamins in your diet.  Drink plenty of water and other clear fluids, such as apple juice and broth. Try to drink at least 48-64 oz (1.5-2 L) of water a day.  Include foods fortified with vitamins and minerals in your diet. Commonly fortified foods include milk, orange juice, cereal, and bread.  Eat a variety of foods that are high in omega-3 and omega-6 fatty acids. These include fish, nuts and seeds, and soybeans. These foods may help your liver to recover and may also stabilize your mood.  If you are a vegetarian: ? Eat a variety of protein-rich foods. ? Pair whole grains with plant-based proteins at meals and snack time. For example, eat rice with beans, put peanut butter on whole-grain toast, or eat oatmeal with sunflower seeds. The items listed above may not be a complete list of foods and beverages you can eat. Contact a dietitian for more information. Foods to avoid  Avoid foods and drinks that are high in fat and sugar. Sugary drinks, salty snacks, and candy contain empty calories. This means that they lack important nutrients such as protein, fiber, and vitamins.  Avoid alcohol. This is the best way to avoid malnutrition due to  alcohol abuse. If you must drink, drink measured amounts. Measured drinking means limiting your intake to no more than 1 drink a day for nonpregnant women and 2 drinks a day for men. One drink equals 12 oz (355 mL) of beer, 5 oz (148 mL) of wine, or 1 oz (44 mL) of hard liquor.  Limit your intake of caffeine. Replace drinks like coffee and black tea with decaffeinated coffee and decaffeinated herbal tea. The items listed above may not be a complete list of foods and beverages you should avoid. Contact a dietitian for more information. Summary  Alcohol abuse can cause poor nutrition (malnutrition or malnourishment) and a lack of nutrients (nutrient deficiencies), which can lead to more health problems.  Common nutrient deficiencies include vitamin deficiencies (A, B, C, and D) and mineral deficiencies (calcium, iron, magnesium, and zinc).  Nutrition is an essential factor in therapy for alcohol abuse.  Your health care provider and dietitian can help you to develop a specific eating plan that includes a balanced diet plus vitamin and  mineral supplements. This information is not intended to replace advice given to you by your health care provider. Make sure you discuss any questions you have with your health care provider. Document Released: 08/24/2005 Document Revised: 07/03/2018 Document Reviewed: 07/17/2017 Elsevier Interactive Patient Education  2019 [employer]   Alcohol Withdrawal Syndrome When a person who drinks a lot of alcohol stops drinking, he or she may have unpleasant and serious symptoms. These symptoms are called alcohol withdrawal syndrome. This condition may be mild or severe. It can be life-threatening. It can cause:  Shaking that you cannot control (tremor).  Sweating.  Headache.  Feeling fearful, upset, grouchy, or depressed.  Trouble sleeping (insomnia).  Nightmares.  Fast or uneven heartbeats (palpitations).  Alcohol cravings.  Feeling sick to your  stomach (nausea).  Throwing up (vomiting).  Being bothered by light and sounds.  Confusion.  Trouble thinking clearly.  Not being hungry (loss of appetite).  Big changes in mood (mood swings). If you have all of the following symptoms at the same time, get help right away:  High blood pressure.  Fast heartbeat.  Trouble breathing.  Seizures.  Seeing, hearing, feeling, smelling, or tasting things that are not there (hallucinations). These symptoms are known as delirium tremens (DTs). They must be treated at the hospital right away. Follow these instructions at home:   Take over-the-counter and prescription medicines only as told by your doctor. This includes vitamins.  Do not drink alcohol.  Do not drive until your doctor says that this is safe for you.  Have someone stay with you or be available in case you need help. This should be someone you trust. This person can help you with your symptoms. He or she can also help you to not drink.  Drink enough fluid to keep your pee (urine) pale yellow.  Think about joining a support group or a treatment program to help you stop drinking.  Keep all follow-up visits as told by your doctor. This is important. Contact a doctor if:  Your symptoms get worse.  You cannot eat or drink without throwing up.  You have a hard time not drinking alcohol.  You cannot stop drinking alcohol. Get help right away if:  You have fast or uneven heartbeats.  You have chest pain.  You have trouble breathing.  You have a seizure for the first time.  You see, hear, feel, smell, or taste something that is not there.  You get very confused. Summary  When a person who drinks a lot of alcohol stops drinking, he or she may have serious symptoms. This is called alcohol withdrawal syndrome.  Delirium tremens (DTs) is a group of life-threatening symptoms. You should get help right away if you have these symptoms.  Think about joining an  alcohol support group or a treatment program. This information is not intended to replace advice given to you by your health care provider. Make sure you discuss any questions you have with your health care provider. Document Released: 04/17/2008 Document Revised: 07/06/2017 Document Reviewed: 07/06/2017 Elsevier Interactive Patient Education  2019 [employer]

## 2018-11-20 NOTE — Care Management Note (Signed)
Case Management Note  Patient Details  Name: BUNNY SMEDBERG MRN: [id number] Date of Birth: [birth year]  Subjective/Objective:        Patient is under observation after a fall.  Patient lives alone and reports he is independent, he walks in his home with a walker and he has a cane.  RNCM offered choice for home health PT and patient refuses home health.  Patient does not drive but he has 2 sisters and they provide transportation.  PC[facility] as [facility].  No other discharge needs identified at this time. Robbie Lis RN BSN [phone]             Action/Plan:   Expected Discharge Date:                  Expected Discharge Plan:  Home/Self Care  In-House Referral:     Discharge planning Services  CM Consult  Post Acute Care Choice:    Choice offered to:     DME Arranged:    DME Agency:     HH Arranged:    HH Agency:     Status of Service:  Completed, signed off  If discussed at Long Length of Stay Meetings, dates discussed:    Additional Comments:  Allayne Butcher, RN 11/20/2018, 1:56 PM

## 2018-11-20 NOTE — Discharge Summary (Signed)
[facility] - [place] at [facility]   PATIENT NAME: Phillip Juarez    MR#:  [id number]  DATE OF BIRTH:  11-30-1965  DATE OF ADMISSION:  11/19/2018 ADMITTING PHYSICIAN: Altamese Dilling, MD  DATE OF DISCHARGE: 11/20/2018   PRIMARY CARE PHYSICIAN: [facility]    ADMISSION DIAGNOSIS:  Ataxia [R27.0] Liver cirrhosis (HCC) [K74.60] Wernicke's encephalopathy [E51.2] Alcohol withdrawal syndrome with complication (HCC) [F10.239]  DISCHARGE DIAGNOSIS:  Principal Problem:   Fall at home, initial encounter Active Problems:   Confusion   SECONDARY DIAGNOSIS:   Past Medical History:  Diagnosis Date  . Alcoholism /alcohol abuse (HCC)   . 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  Ammonia level is slightly high.  give lactulose. Clinically patient improved now alert and oriented.  *Chronic alcoholism keep on CIWA protocol for any signs of withdrawal.  Currently there is no sign. Advised to continue thiamine and folic acid at discharge and quit alcohol.  *Hypokalemia Replace and check magnesium.  Improved.  *Frequent fall This could be secondary to alcohol induced neuropathy or ataxia. Appreciated neurology consult, suggested to stop amitriptyline as that can do ataxia and no further intervention. We had offered to arrange for home health and therapy at discharge but patient refuses for that in his full mental capacity, I spoke with patient's sister and informed her about this decision.  He is at high risk of frequent falls as he already is falling every day but when he does not want to change his alcohol habits and does not want to have the home health arrangements there is not much to help him.  *Active smoking Counseled to quit smoking and offered nicotine patch, time spent 4 minutes.  DISCHARGE CONDITIONS:   Stable.  CONSULTS OBTAINED:  Treatment Team:   Kym Groom, MD Thana Farr, MD  DRUG ALLERGIES:  No Known Allergies  DISCHARGE MEDICATIONS:   Allergies as of 11/20/2018   No Known Allergies     Medication List    STOP taking these medications   amitriptyline 150 MG tablet Commonly known as:  ELAVIL     TAKE these medications   alprazolam 2 MG tablet Commonly known as:  XANAX Take 2 mg by mouth 4 (four) times daily.   feeding supplement (ENSURE ENLIVE) Liqd Take 237 mLs by mouth 3 (three) times daily between meals.   folic acid 1 MG tablet Commonly known as:  FOLVITE Take 1 mg by mouth daily.   thiamine 100 MG tablet Commonly known as:  VITAMIN B-1 Take 100 mg by mouth daily.   Vitamin D (Ergocalciferol) 1.25 MG (50000 UT) Caps capsule Commonly known as:  DRISDOL Take 100,000 Units by mouth every 7 (seven) days.        DISCHARGE INSTRUCTIONS:    Follow with primary care physician in 1 to 2 weeks.  If you experience worsening of your admission symptoms, develop shortness of breath, life threatening emergency, suicidal or homicidal thoughts you must seek medical attention immediately by calling 911 or calling your MD immediately  if symptoms less severe.  You Must read complete instructions/literature along with all the possible adverse reactions/side effects for all the Medicines you take and that have been prescribed to you. Take any new Medicines after you have completely understood and accept all the possible adverse reactions/side effects.   Please note  You were cared for by a hospitalist during your hospital stay. If you  have any questions about your discharge medications or the care you received while you were in the hospital after you are discharged, you can call the unit and asked to speak with the hospitalist on call if the hospitalist that took care of you is not available. Once you are discharged, your primary care physician will handle any further medical issues. Please note that NO  REFILLS for any discharge medications will be authorized once you are discharged, as it is imperative that you return to your primary care physician (or establish a relationship with a primary care physician if you do not have one) for your aftercare needs so that they can reassess your need for medications and monitor your lab values.    Today   CHIEF COMPLAINT:   Chief Complaint  Patient presents with  . Fall    HISTORY OF PRESENT ILLNESS:  Phillip Juarez  is a 53 y.o. male with a known history of anxiety, chronic alcoholism-lives alone and drinks 3-4 drinks daily-had generalized weakness and fall and was admitted last month with rhabdomyolysis.  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  Since last discharge he was supposed to have home health refusing therapy at his home but that never was started. He again had a fall last night and could not get up so brought to the emergency room by his sister.  He denies any associated symptoms.  As per sisters he is slightly confused and his ammonia level was noted to be elevated.   VITAL SIGNS:  Blood pressure 118/84, pulse (!) 108, temperature 97.8 F (36.6 C), temperature source Oral, resp. rate 20, height 5\' 7"  (1.702 m), weight 89.8 kg, SpO2 95 %.  I/O:    Intake/Output Summary (Last 24 hours) at 11/20/2018 1451 Last data filed at 11/20/2018 1357 Gross per 24 hour  Intake 720 ml  Output -  Net 720 ml    PHYSICAL EXAMINATION:   GENERAL:  53 y.o.-year-old patient lying in the bed with no acute distress.  EYES: Pupils equal, round, reactive to light and accommodation. No scleral icterus. Extraocular muscles intact.  HEENT: Head atraumatic, normocephalic. Oropharynx and nasopharynx clear.  NECK:  Supple, no jugular venous distention. No thyroid enlargement, no tenderness.  LUNGS: Normal breath sounds bilaterally, no wheezing, rales,rhonchi or crepitation. No use of  accessory muscles of respiration.  CARDIOVASCULAR: S1, S2 normal. No murmurs, rubs, or gallops.  ABDOMEN: Soft, nontender, nondistended. Bowel sounds present. No organomegaly or mass.  EXTREMITIES: No pedal edema, cyanosis, or clubbing.  NEUROLOGIC: Cranial nerves II through XII are intact. Muscle strength 4/5 in all extremities. Sensation intact. Gait not checked.  PSYCHIATRIC: The patient is alert and oriented x 3.  SKIN: No obvious rash, lesion, or ulcer.   DATA REVIEW:   CBC Recent Labs  Lab 11/20/18 0510  WBC 5.1  HGB 14.0  HCT 42.2  PLT 105*    Chemistries  Recent Labs  Lab 11/19/18 0436 11/19/18 1123 11/20/18 0510  NA 140  --  139  K 3.3*  --  3.6  CL 103  --  105  CO2 28  --  26  GLUCOSE 90  --  96  BUN 6  --  9  CREATININE 0.49*  --  0.38*  CALCIUM 9.3  --  9.0  MG  --  2.2  --   AST 33  --   --   ALT 15  --   --  ALKPHOS 45  --   --   BILITOT 0.9  --   --     Cardiac Enzymes No results for input(s): TROPONINI in the last 168 hours.  Microbiology Results  Results for orders placed or performed during the hospital encounter of 10/05/18  Urine culture     Status: None   Collection Time: 10/05/18 11:39 AM  Result Value Ref Range Status   Specimen Description   Final    URINE, RANDOM Performed at [facility], [address] [place], [place] [address]    Special Requests   Final    NONE Performed at [facility], [address] [place], [place] [address]    Culture   Final    NO GROWTH Performed at [facility], [address] [place], [place] [address]    Report Status 10/06/2018 FINAL  Final    RADIOLOGY:  Kore[place] Abdomen Limited Ruq  Result Date: 11/19/2018 CLINICAL DATA:  History of cirrhosis EXAM: ULTRASOUND ABDOMEN LIMITED RIGHT UPPER QUADRANT COMPARISON:  07/25/2007 FINDINGS: Gallbladder: Gallbladder is well distended with multiple dependent small gallstones. No wall thickening or pericholecystic fluid is noted.  Mild gallbladder sludge is seen. Common bile duct: Diameter: 3 mm Liver: Diffuse increased echogenicity is noted consistent with the given clinical history. Comparison with prior CT shows some evidence of fatty infiltration as well. Portal vein is patent on color Doppler imaging with normal direction of blood flow towards the liver. IMPRESSION: Diffuse increased echogenicity within the liver without focal mass. This corresponds with the patient's given clinical history. Cholelithiasis and gallbladder sludge without complicating factors. Electronically Signed   By: Alcide CleverMark  Lukens M.D.   On: 11/19/2018 09:39    EKG:   Orders placed or performed during the hospital encounter of 10/05/18  . ED EKG  . ED EKG  . EKG 12-Lead  . EKG 12-Lead  . EKG 12-Lead  . EKG 12-Lead      Management plans discussed with the patient, family and they are in agreement.  CODE STATUS:     Code Status Orders  (From admission, onward)         Start     Ordered   11/19/18 1114  Full code  Continuous     11/19/18 1113        Code Status History    Date Active Date Inactive Code Status Order ID Comments User Context   10/05/2018 1306 10/09/2018 1721 Full Code [id number]  Adrian SaranMody, Sital, MD Inpatient    Advance Directive Documentation     Most Recent Value  Type of Advance Directive  Healthcare Power of Attorney  Pre-existing out of facility DNR order (yellow form or pink MOST form)  -  "MOST" Form in Place?  -      TOTAL TIME TAKING CARE OF THIS PATIENT: 35 minutes.    Altamese DillingVaibhavkumar Taylia Berber M.D on 11/20/2018 at 2:51 PM  Between 7am to 6pm - Pager - [phone]  After 6pm go to [email or web address] - Social research officer, governmentpassword [email or web address]  [facility]  Office  [phone]  CC: Primary care physician; [facility]   Note: This dictation was prepared with Dragon dictation along with smaller phrase technology. Any transcriptional errors that result from this process are unintentional.

## 2018-11-21 LAB — VITAMIN B1: Vitamin B1 (Thiamine): 104.6 nmol/L (ref 66.5–200.0)

## 2019-06-12 ENCOUNTER — Ambulatory Visit: Payer: Self-pay | Admitting: Podiatry

## 2021-01-10 ENCOUNTER — Ambulatory Visit: Payer: Medicaid Other | Admitting: Podiatry

## 2022-03-18 ENCOUNTER — Encounter (HOSPITAL_COMMUNITY): Payer: Self-pay | Admitting: Emergency Medicine

## 2022-03-18 ENCOUNTER — Other Ambulatory Visit: Payer: Self-pay

## 2022-03-18 ENCOUNTER — Emergency Department (HOSPITAL_COMMUNITY): Payer: Medicaid Other

## 2022-03-18 ENCOUNTER — Inpatient Hospital Stay (HOSPITAL_COMMUNITY)
Admission: EM | Admit: 2022-03-18 | Discharge: 2022-04-18 | DRG: 208 | Disposition: A | Discharge status: Skilled Nursing Facility | Payer: Medicaid Other | Attending: Surgery | Admitting: Surgery

## 2022-03-18 DIAGNOSIS — R040 Epistaxis: Secondary | ICD-10-CM | POA: Diagnosis not present

## 2022-03-18 DIAGNOSIS — Z6831 Body mass index (BMI) 31.0-31.9, adult: Secondary | ICD-10-CM

## 2022-03-18 DIAGNOSIS — F10239 Alcohol dependence with withdrawal, unspecified: Secondary | ICD-10-CM | POA: Diagnosis not present

## 2022-03-18 DIAGNOSIS — S2242XA Multiple fractures of ribs, left side, initial encounter for closed fracture: Secondary | ICD-10-CM | POA: Diagnosis not present

## 2022-03-18 DIAGNOSIS — J9601 Acute respiratory failure with hypoxia: Secondary | ICD-10-CM | POA: Diagnosis not present

## 2022-03-18 DIAGNOSIS — Z79899 Other long term (current) drug therapy: Secondary | ICD-10-CM

## 2022-03-18 DIAGNOSIS — S27321A Contusion of lung, unilateral, initial encounter: Secondary | ICD-10-CM | POA: Diagnosis present

## 2022-03-18 DIAGNOSIS — S272XXA Traumatic hemopneumothorax, initial encounter: Principal | ICD-10-CM | POA: Diagnosis present

## 2022-03-18 DIAGNOSIS — T797XXA Traumatic subcutaneous emphysema, initial encounter: Secondary | ICD-10-CM | POA: Diagnosis not present

## 2022-03-18 DIAGNOSIS — K7030 Alcoholic cirrhosis of liver without ascites: Secondary | ICD-10-CM | POA: Diagnosis not present

## 2022-03-18 DIAGNOSIS — R339 Retention of urine, unspecified: Secondary | ICD-10-CM | POA: Diagnosis not present

## 2022-03-18 DIAGNOSIS — R319 Hematuria, unspecified: Secondary | ICD-10-CM | POA: Diagnosis not present

## 2022-03-18 DIAGNOSIS — F10220 Alcohol dependence with intoxication, uncomplicated: Secondary | ICD-10-CM | POA: Diagnosis not present

## 2022-03-18 DIAGNOSIS — Z8249 Family history of ischemic heart disease and other diseases of the circulatory system: Secondary | ICD-10-CM

## 2022-03-18 DIAGNOSIS — S2249XA Multiple fractures of ribs, unspecified side, initial encounter for closed fracture: Secondary | ICD-10-CM | POA: Diagnosis present

## 2022-03-18 DIAGNOSIS — F17210 Nicotine dependence, cigarettes, uncomplicated: Secondary | ICD-10-CM | POA: Diagnosis present

## 2022-03-18 DIAGNOSIS — D6959 Other secondary thrombocytopenia: Secondary | ICD-10-CM | POA: Diagnosis not present

## 2022-03-18 DIAGNOSIS — Z20822 Contact with and (suspected) exposure to covid-19: Secondary | ICD-10-CM | POA: Diagnosis not present

## 2022-03-18 DIAGNOSIS — M4854XA Collapsed vertebra, not elsewhere classified, thoracic region, initial encounter for fracture: Secondary | ICD-10-CM | POA: Diagnosis present

## 2022-03-18 DIAGNOSIS — D696 Thrombocytopenia, unspecified: Secondary | ICD-10-CM

## 2022-03-18 DIAGNOSIS — J942 Hemothorax: Secondary | ICD-10-CM

## 2022-03-18 DIAGNOSIS — Y901 Blood alcohol level of 20-39 mg/100 ml: Secondary | ICD-10-CM | POA: Diagnosis present

## 2022-03-18 DIAGNOSIS — J189 Pneumonia, unspecified organism: Secondary | ICD-10-CM | POA: Diagnosis not present

## 2022-03-18 DIAGNOSIS — I959 Hypotension, unspecified: Secondary | ICD-10-CM | POA: Diagnosis not present

## 2022-03-18 DIAGNOSIS — E440 Moderate protein-calorie malnutrition: Secondary | ICD-10-CM | POA: Diagnosis present

## 2022-03-18 DIAGNOSIS — K567 Ileus, unspecified: Secondary | ICD-10-CM | POA: Diagnosis not present

## 2022-03-18 DIAGNOSIS — Z781 Physical restraint status: Secondary | ICD-10-CM

## 2022-03-18 DIAGNOSIS — F1020 Alcohol dependence, uncomplicated: Secondary | ICD-10-CM | POA: Diagnosis present

## 2022-03-18 DIAGNOSIS — J90 Pleural effusion, not elsewhere classified: Secondary | ICD-10-CM

## 2022-03-18 LAB — COMPREHENSIVE METABOLIC PANEL
ALT: 21 U/L (ref 0–44)
AST: 47 U/L — ABNORMAL HIGH (ref 15–41)
Albumin: 3.6 g/dL (ref 3.5–5.0)
Alkaline Phosphatase: 62 U/L (ref 38–126)
Anion gap: 9 (ref 5–15)
BUN: 6 mg/dL (ref 6–20)
CO2: 24 mmol/L (ref 22–32)
Calcium: 8.8 mg/dL — ABNORMAL LOW (ref 8.9–10.3)
Chloride: 109 mmol/L (ref 98–111)
Creatinine, Ser: 0.56 mg/dL — ABNORMAL LOW (ref 0.61–1.24)
GFR, Estimated: >60 mL/min (ref >60)
Glucose, Bld: 105 mg/dL — ABNORMAL HIGH (ref 70–99)
Potassium: 3.7 mmol/L (ref 3.5–5.1)
Sodium: 142 mmol/L (ref 135–145)
Total Bilirubin: 1.9 mg/dL — ABNORMAL HIGH (ref 0.3–1.2)
Total Protein: 7.7 g/dL (ref 6.5–8.1)

## 2022-03-18 LAB — CBC
HCT: 41.2 % (ref 39.0–52.0)
Hemoglobin: 13.3 g/dL (ref 13.0–17.0)
MCH: 29.8 pg (ref 26.0–34.0)
MCHC: 32.3 g/dL (ref 30.0–36.0)
MCV: 92.2 fL (ref 80.0–100.0)
Platelets: 85 10*3/uL — ABNORMAL LOW (ref 150–400)
RBC: 4.47 MIL/uL (ref 4.22–5.81)
RDW: 20.1 % — ABNORMAL HIGH (ref 11.5–15.5)
WBC: 9.3 10*3/uL (ref 4.0–10.5)
nRBC: 0 % (ref 0.0–0.2)

## 2022-03-18 LAB — HIV ANTIBODY (ROUTINE TESTING W REFLEX): HIV Screen 4th Generation wRfx: NONREACTIVE

## 2022-03-18 LAB — ETHANOL: Alcohol, Ethyl (B): 28 mg/dL — ABNORMAL HIGH (ref <10)

## 2022-03-18 MED ORDER — ADULT MULTIVITAMIN W/MINERALS CH
1.0000 | ORAL_TABLET | Freq: Every day | ORAL | Status: DC
  Administered 2022-03-18 – 2022-03-24 (×7): 1 via ORAL
  Filled 2022-03-18 (×7): qty 1

## 2022-03-18 MED ORDER — HYDROMORPHONE HCL 1 MG/ML IJ SOLN
0.5000 mg | INTRAMUSCULAR | Status: DC | PRN
  Administered 2022-03-18: 0.5 mg via INTRAVENOUS
  Filled 2022-03-18: qty 0.5

## 2022-03-18 MED ORDER — ONDANSETRON 4 MG PO TBDP
4.0000 mg | ORAL_TABLET | Freq: Four times a day (QID) | ORAL | Status: DC | PRN
Start: 2022-03-18 — End: 2022-04-18

## 2022-03-18 MED ORDER — DOCUSATE SODIUM 100 MG PO CAPS
100.0000 mg | ORAL_CAPSULE | Freq: Two times a day (BID) | ORAL | Status: DC
  Administered 2022-03-18 – 2022-03-24 (×9): 100 mg via ORAL
  Filled 2022-03-18 (×11): qty 1

## 2022-03-18 MED ORDER — CALCIUM CARBONATE ANTACID 500 MG PO CHEW: 1.0000 | CHEWABLE_TABLET | Freq: Three times a day (TID) | ORAL | Status: DC | PRN

## 2022-03-18 MED ORDER — ONDANSETRON HCL 4 MG/2ML IJ SOLN
4.0000 mg | Freq: Four times a day (QID) | INTRAMUSCULAR | Status: DC | PRN
  Administered 2022-03-29: 4 mg via INTRAVENOUS
  Filled 2022-03-18: qty 2

## 2022-03-18 MED ORDER — LORAZEPAM 2 MG/ML IJ SOLN: 1.0000 mg | INTRAMUSCULAR | Status: AC | PRN

## 2022-03-18 MED ORDER — FOLIC ACID 1 MG PO TABS
1.0000 mg | ORAL_TABLET | Freq: Every day | ORAL | Status: DC
Start: 2022-03-19 — End: 2022-03-24
  Administered 2022-03-19 – 2022-03-24 (×6): 1 mg via ORAL
  Filled 2022-03-18 (×6): qty 1

## 2022-03-18 MED ORDER — FUROSEMIDE 20 MG PO TABS
20.0000 mg | ORAL_TABLET | Freq: Every day | ORAL | Status: DC
  Administered 2022-03-19 – 2022-03-24 (×6): 20 mg via ORAL
  Filled 2022-03-18 (×6): qty 1

## 2022-03-18 MED ORDER — ENOXAPARIN SODIUM 30 MG/0.3ML IJ SOSY: 30.0000 mg | PREFILLED_SYRINGE | Freq: Two times a day (BID) | INTRAMUSCULAR | Status: DC

## 2022-03-18 MED ORDER — LORAZEPAM 1 MG PO TABS: 1.0000 mg | ORAL_TABLET | ORAL | Status: AC | PRN

## 2022-03-18 MED ORDER — THIAMINE HCL 100 MG/ML IJ SOLN
100.0000 mg | Freq: Every day | INTRAMUSCULAR | Status: DC
  Administered 2022-03-23 – 2022-03-24 (×2): 100 mg via INTRAVENOUS
  Filled 2022-03-18 (×2): qty 2

## 2022-03-18 MED ORDER — AMITRIPTYLINE HCL 25 MG PO TABS
50.0000 mg | ORAL_TABLET | Freq: Every evening | ORAL | Status: DC
  Administered 2022-03-18 – 2022-03-23 (×6): 50 mg via ORAL
  Filled 2022-03-18 (×6): qty 1

## 2022-03-18 MED ORDER — HYDROMORPHONE HCL 1 MG/ML IJ SOLN
0.5000 mg | Freq: Once | INTRAMUSCULAR | Status: AC
  Administered 2022-03-18: 0.5 mg via INTRAVENOUS
  Filled 2022-03-18: qty 0.5

## 2022-03-18 MED ORDER — THIAMINE HCL 100 MG PO TABS
100.0000 mg | ORAL_TABLET | Freq: Every day | ORAL | Status: DC
  Administered 2022-03-18 – 2022-03-22 (×5): 100 mg via ORAL
  Filled 2022-03-18 (×7): qty 1

## 2022-03-18 MED ORDER — MIRTAZAPINE 15 MG PO TABS
30.0000 mg | ORAL_TABLET | Freq: Every day | ORAL | Status: DC
Start: 2022-03-18 — End: 2022-03-24
  Administered 2022-03-18 – 2022-03-23 (×6): 30 mg via ORAL
  Filled 2022-03-18: qty 2
  Filled 2022-03-18: qty 4
  Filled 2022-03-18: qty 2
  Filled 2022-03-18 (×3): qty 4

## 2022-03-18 MED ORDER — ALPRAZOLAM ER 1 MG PO TB24: 2.0000 mg | ORAL_TABLET | ORAL | Status: DC

## 2022-03-18 MED ORDER — ONDANSETRON HCL 4 MG/2ML IJ SOLN: 4.0000 mg | Freq: Three times a day (TID) | INTRAMUSCULAR | Status: AC | PRN

## 2022-03-18 MED ORDER — OXYCODONE HCL 5 MG PO TABS
5.0000 mg | ORAL_TABLET | ORAL | Status: DC | PRN
  Administered 2022-03-18 – 2022-03-19 (×2): 10 mg via ORAL
  Filled 2022-03-18 (×2): qty 2

## 2022-03-18 MED ORDER — GABAPENTIN 300 MG PO CAPS
300.0000 mg | ORAL_CAPSULE | Freq: Three times a day (TID) | ORAL | Status: DC
Start: 2022-03-18 — End: 2022-03-24
  Administered 2022-03-18 – 2022-03-24 (×17): 300 mg via ORAL
  Filled 2022-03-18 (×17): qty 1

## 2022-03-18 MED ORDER — HYDROMORPHONE HCL 1 MG/ML IJ SOLN
0.5000 mg | INTRAMUSCULAR | Status: DC | PRN
  Administered 2022-03-18 – 2022-03-19 (×4): 0.5 mg via INTRAVENOUS
  Filled 2022-03-18 (×4): qty 0.5

## 2022-03-18 MED ORDER — LIDOCAINE-EPINEPHRINE (PF) 2 %-1:200000 IJ SOLN
20.0000 mL | Freq: Once | INTRAMUSCULAR | Status: DC
  Filled 2022-03-18: qty 20

## 2022-03-18 MED ORDER — METHOCARBAMOL 750 MG PO TABS
750.0000 mg | ORAL_TABLET | Freq: Four times a day (QID) | ORAL | Status: DC
  Administered 2022-03-18 – 2022-03-19 (×3): 750 mg via ORAL
  Filled 2022-03-18 (×3): qty 1

## 2022-03-18 MED ORDER — ALPRAZOLAM 0.5 MG PO TABS
1.0000 mg | ORAL_TABLET | Freq: Two times a day (BID) | ORAL | Status: DC
Start: 2022-03-18 — End: 2022-03-24
  Administered 2022-03-18 – 2022-03-24 (×12): 1 mg via ORAL
  Filled 2022-03-18: qty 2
  Filled 2022-03-18: qty 4
  Filled 2022-03-18: qty 2
  Filled 2022-03-18: qty 4
  Filled 2022-03-18: qty 2
  Filled 2022-03-18: qty 4
  Filled 2022-03-18 (×3): qty 2
  Filled 2022-03-18: qty 4
  Filled 2022-03-18 (×2): qty 2
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
## 2022-03-18 NOTE — ED Notes (Signed)
Pt left great toenail has fallen off.  ?

## 2022-03-18 NOTE — ED Notes (Signed)
Left great toe cleaned and dressing applied.  ?

## 2022-03-19 ENCOUNTER — Observation Stay (HOSPITAL_COMMUNITY): Payer: Medicaid Other

## 2022-03-19 DIAGNOSIS — D6959 Other secondary thrombocytopenia: Secondary | ICD-10-CM | POA: Diagnosis present

## 2022-03-19 DIAGNOSIS — M4854XA Collapsed vertebra, not elsewhere classified, thoracic region, initial encounter for fracture: Secondary | ICD-10-CM | POA: Diagnosis present

## 2022-03-19 DIAGNOSIS — Y901 Blood alcohol level of 20-39 mg/100 ml: Secondary | ICD-10-CM | POA: Diagnosis present

## 2022-03-19 DIAGNOSIS — Z781 Physical restraint status: Secondary | ICD-10-CM | POA: Diagnosis not present

## 2022-03-19 DIAGNOSIS — T797XXA Traumatic subcutaneous emphysema, initial encounter: Secondary | ICD-10-CM | POA: Diagnosis present

## 2022-03-19 DIAGNOSIS — S27321A Contusion of lung, unilateral, initial encounter: Secondary | ICD-10-CM | POA: Diagnosis present

## 2022-03-19 DIAGNOSIS — J90 Pleural effusion, not elsewhere classified: Secondary | ICD-10-CM | POA: Diagnosis not present

## 2022-03-19 DIAGNOSIS — J189 Pneumonia, unspecified organism: Secondary | ICD-10-CM | POA: Diagnosis not present

## 2022-03-19 DIAGNOSIS — J9601 Acute respiratory failure with hypoxia: Secondary | ICD-10-CM | POA: Diagnosis not present

## 2022-03-19 DIAGNOSIS — F17210 Nicotine dependence, cigarettes, uncomplicated: Secondary | ICD-10-CM | POA: Diagnosis present

## 2022-03-19 DIAGNOSIS — Z20822 Contact with and (suspected) exposure to covid-19: Secondary | ICD-10-CM | POA: Diagnosis present

## 2022-03-19 DIAGNOSIS — R339 Retention of urine, unspecified: Secondary | ICD-10-CM | POA: Diagnosis not present

## 2022-03-19 DIAGNOSIS — Z79899 Other long term (current) drug therapy: Secondary | ICD-10-CM | POA: Diagnosis not present

## 2022-03-19 DIAGNOSIS — F1020 Alcohol dependence, uncomplicated: Secondary | ICD-10-CM | POA: Diagnosis present

## 2022-03-19 DIAGNOSIS — R040 Epistaxis: Secondary | ICD-10-CM | POA: Diagnosis not present

## 2022-03-19 DIAGNOSIS — Z6831 Body mass index (BMI) 31.0-31.9, adult: Secondary | ICD-10-CM | POA: Diagnosis not present

## 2022-03-19 DIAGNOSIS — S272XXA Traumatic hemopneumothorax, initial encounter: Secondary | ICD-10-CM | POA: Diagnosis present

## 2022-03-19 DIAGNOSIS — F10239 Alcohol dependence with withdrawal, unspecified: Secondary | ICD-10-CM | POA: Diagnosis present

## 2022-03-19 DIAGNOSIS — E440 Moderate protein-calorie malnutrition: Secondary | ICD-10-CM | POA: Diagnosis present

## 2022-03-19 DIAGNOSIS — R319 Hematuria, unspecified: Secondary | ICD-10-CM | POA: Diagnosis not present

## 2022-03-19 DIAGNOSIS — S2242XA Multiple fractures of ribs, left side, initial encounter for closed fracture: Secondary | ICD-10-CM | POA: Diagnosis present

## 2022-03-19 DIAGNOSIS — Z8249 Family history of ischemic heart disease and other diseases of the circulatory system: Secondary | ICD-10-CM | POA: Diagnosis not present

## 2022-03-19 DIAGNOSIS — J942 Hemothorax: Secondary | ICD-10-CM | POA: Diagnosis not present

## 2022-03-19 DIAGNOSIS — F10220 Alcohol dependence with intoxication, uncomplicated: Secondary | ICD-10-CM | POA: Diagnosis present

## 2022-03-19 DIAGNOSIS — K7030 Alcoholic cirrhosis of liver without ascites: Secondary | ICD-10-CM | POA: Diagnosis present

## 2022-03-19 DIAGNOSIS — I959 Hypotension, unspecified: Secondary | ICD-10-CM | POA: Diagnosis not present

## 2022-03-19 DIAGNOSIS — S2249XA Multiple fractures of ribs, unspecified side, initial encounter for closed fracture: Secondary | ICD-10-CM | POA: Diagnosis present

## 2022-03-19 DIAGNOSIS — K567 Ileus, unspecified: Secondary | ICD-10-CM | POA: Diagnosis not present

## 2022-03-19 LAB — URINALYSIS, ROUTINE W REFLEX MICROSCOPIC
Bacteria, UA: NONE SEEN
Glucose, UA: NEGATIVE mg/dL
Hgb urine dipstick: NEGATIVE
Ketones, ur: NEGATIVE mg/dL
Leukocytes,Ua: NEGATIVE
Nitrite: NEGATIVE
Protein, ur: NEGATIVE mg/dL
Specific Gravity, Urine: 1.035 — ABNORMAL HIGH (ref 1.005–1.030)
WBC, UA: >50 WBC/hpf — ABNORMAL HIGH (ref 0–5)
pH: 5 (ref 5.0–8.0)

## 2022-03-19 LAB — COMPREHENSIVE METABOLIC PANEL
ALT: 17 U/L (ref 0–44)
AST: 35 U/L (ref 15–41)
Albumin: 2.9 g/dL — ABNORMAL LOW (ref 3.5–5.0)
Alkaline Phosphatase: 35 U/L — ABNORMAL LOW (ref 38–126)
Anion gap: 6 (ref 5–15)
BUN: <5 mg/dL — ABNORMAL LOW (ref 6–20)
CO2: 29 mmol/L (ref 22–32)
Calcium: 8.8 mg/dL — ABNORMAL LOW (ref 8.9–10.3)
Chloride: 101 mmol/L (ref 98–111)
Creatinine, Ser: 0.71 mg/dL (ref 0.61–1.24)
GFR, Estimated: >60 mL/min (ref >60)
Glucose, Bld: 140 mg/dL — ABNORMAL HIGH (ref 70–99)
Potassium: 3.7 mmol/L (ref 3.5–5.1)
Sodium: 136 mmol/L (ref 135–145)
Total Bilirubin: 2.4 mg/dL — ABNORMAL HIGH (ref 0.3–1.2)
Total Protein: 6.6 g/dL (ref 6.5–8.1)

## 2022-03-19 LAB — CBC
HCT: 34.9 % — ABNORMAL LOW (ref 39.0–52.0)
Hemoglobin: 10.9 g/dL — ABNORMAL LOW (ref 13.0–17.0)
MCH: 29.2 pg (ref 26.0–34.0)
MCHC: 31.2 g/dL (ref 30.0–36.0)
MCV: 93.6 fL (ref 80.0–100.0)
Platelets: 69 10*3/uL — ABNORMAL LOW (ref 150–400)
RBC: 3.73 MIL/uL — ABNORMAL LOW (ref 4.22–5.81)
RDW: 19.3 % — ABNORMAL HIGH (ref 11.5–15.5)
WBC: 14.1 10*3/uL — ABNORMAL HIGH (ref 4.0–10.5)
nRBC: 0.1 % (ref 0.0–0.2)

## 2022-03-19 LAB — MRSA NEXT GEN BY PCR, NASAL: MRSA by PCR Next Gen: NOT DETECTED

## 2022-03-19 LAB — PROTIME-INR
INR: 1.4 — ABNORMAL HIGH (ref 0.8–1.2)
Prothrombin Time: 16.8 seconds — ABNORMAL HIGH (ref 11.4–15.2)

## 2022-03-19 MED ORDER — METHOCARBAMOL 500 MG PO TABS
1000.0000 mg | ORAL_TABLET | Freq: Four times a day (QID) | ORAL | Status: DC
Start: 2022-03-19 — End: 2022-03-24
  Administered 2022-03-19 – 2022-03-24 (×19): 1000 mg via ORAL
  Filled 2022-03-19 (×19): qty 2

## 2022-03-19 MED ORDER — OXYCODONE HCL 5 MG PO TABS
10.0000 mg | ORAL_TABLET | ORAL | Status: DC | PRN
  Administered 2022-03-19: 10 mg via ORAL
  Filled 2022-03-19: qty 2

## 2022-03-19 MED ORDER — ACETAMINOPHEN 325 MG PO TABS
650.0000 mg | ORAL_TABLET | Freq: Four times a day (QID) | ORAL | Status: DC
  Administered 2022-03-19 (×3): 650 mg via ORAL
  Filled 2022-03-19 (×4): qty 2

## 2022-03-19 MED ORDER — LIDOCAINE 5 % EX PTCH
1.0000 | MEDICATED_PATCH | CUTANEOUS | Status: DC
  Administered 2022-03-19 – 2022-04-18 (×31): 1 via TRANSDERMAL
  Filled 2022-03-19 (×32): qty 1

## 2022-03-19 MED ORDER — LACTATED RINGERS IV BOLUS
1000.0000 mL | Freq: Once | INTRAVENOUS | Status: AC
  Administered 2022-03-19: 1000 mL via INTRAVENOUS

## 2022-03-19 MED ORDER — ENSURE ENLIVE PO LIQD
237.0000 mL | Freq: Three times a day (TID) | ORAL | Status: DC
  Administered 2022-03-19 – 2022-03-23 (×11): 237 mL via ORAL

## 2022-03-19 MED ORDER — KCL IN DEXTROSE-NACL 10-5-0.45 MEQ/L-%-% IV SOLN
INTRAVENOUS | Status: DC
Start: 2022-03-19 — End: 2022-03-20
  Filled 2022-03-19 (×2): qty 1000

## 2022-03-19 NOTE — Progress Notes (Signed)
Transferred -in from [address] [place] by bed awake and alert. ?

## 2022-03-19 NOTE — TOC CAGE-AID Note (Signed)
Transition of Care (TOC) - CAGE-AID Screening ? ? ?Patient Details  ?Name: Phillip Juarez ?MRN: [id number] ?Date of Birth: 12-03-1965 ? ?Clinical Narrative: ? ?Patient attempted to participate in screening, quite confused this AM. Alert and oriented but unsure if patient able to give accurate answers. Substance abuse social work consult placed for future counseling. Provided patient with resources. Patient may benefit for a screening when he is less confused and able to participate. ? ?CAGE-AID Screening: ?  ? ?Have You Ever Felt You Ought to Cut Down on Your Drinking or Drug Use?: Yes ?Have People Annoyed You By Critizing Your Drinking Or Drug Use?: No ?Have You Felt Bad Or Guilty About Your Drinking Or Drug Use?: No ?Have You Ever Had a Drink or Used Drugs First Thing In The Morning to Steady Your Nerves or to Get Rid of a Hangover?: No ?CAGE-AID Score: 1 ? ?Substance Abuse Education Offered: Yes ? ?Substance abuse interventions: Patient and Family Counseling ? ? ? ? ? ? ?

## 2022-03-19 NOTE — Progress Notes (Signed)
Sister [name] verbalized her concern about the pt being alone at home and cannot do anything for himself. She is requesting for skilled nursing facility placement  on discharge. Explained to her the process and she is amenable to it. ?

## 2022-03-19 NOTE — Progress Notes (Signed)
Pt for transfer to [facility]. Vitals updated. LR bolus being given presently. ?

## 2022-03-19 NOTE — Progress Notes (Signed)
Trauma Event Note ? ?Received call from [facility] on 6N for transfer assistance. Helped transfer patient from 6N03 to 2C07 due to increase O2 requirements and CIWA. Patient alert throughout transfer, placed on 4L [place]. Bedside handoff with 2C RN Char. Patient vitals taken and stable post transfer, placed on 4L [place] with O2 90%. No further needs at this time. ? ?Last imported Vital Signs ?BP 126/74 (BP Location: Left Arm)   Pulse (!) 104   Temp 98.3 ?F (36.8 ?C) (Oral)   Resp (!) 0   Ht 5\' 7"  (1.702 m)   Wt 91.6 kg   SpO2 92%   BMI 31.64 kg/m?  ? ?Trending CBC ?Recent Labs  ?  03/18/22 ?0701  ?WBC 9.3  ?HGB 13.3  ?HCT 41.2  ?PLT 85*  ? ? ?Trending BMET ?Recent Labs  ?  03/18/22 ?0701  ?NA 142  ?K 3.7  ?CL 109  ?CO2 24  ?BUN 6  ?CREATININE 0.56*  ?GLUCOSE 105*  ? ? ?05/18/22 Belynda Pagaduan  ?Trauma Response RN ? ?Please call TRN at [phone] for further assistance. ? ? ?  ?

## 2022-03-19 NOTE — Progress Notes (Signed)
Set up for dinner, refused to eat this time claiming he needs to go sleep. Tray saved for later. ?
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
## 2022-03-20 ENCOUNTER — Inpatient Hospital Stay (HOSPITAL_COMMUNITY): Payer: Medicaid Other

## 2022-03-20 DIAGNOSIS — Z1330 Encounter for screening examination for mental health and behavioral disorders, unspecified: Secondary | ICD-10-CM

## 2022-03-20 LAB — CBC
HCT: 30.9 % — ABNORMAL LOW (ref 39.0–52.0)
Hemoglobin: 9.7 g/dL — ABNORMAL LOW (ref 13.0–17.0)
MCH: 29.1 pg (ref 26.0–34.0)
MCHC: 31.4 g/dL (ref 30.0–36.0)
MCV: 92.8 fL (ref 80.0–100.0)
Platelets: 58 10*3/uL — ABNORMAL LOW (ref 150–400)
RBC: 3.33 MIL/uL — ABNORMAL LOW (ref 4.22–5.81)
RDW: 18.9 % — ABNORMAL HIGH (ref 11.5–15.5)
WBC: 11.4 10*3/uL — ABNORMAL HIGH (ref 4.0–10.5)
nRBC: 0 % (ref 0.0–0.2)

## 2022-03-20 LAB — BASIC METABOLIC PANEL
Anion gap: 3 — ABNORMAL LOW (ref 5–15)
BUN: <5 mg/dL — ABNORMAL LOW (ref 6–20)
CO2: 29 mmol/L (ref 22–32)
Calcium: 8.6 mg/dL — ABNORMAL LOW (ref 8.9–10.3)
Chloride: 103 mmol/L (ref 98–111)
Creatinine, Ser: 0.54 mg/dL — ABNORMAL LOW (ref 0.61–1.24)
GFR, Estimated: >60 mL/min (ref >60)
Glucose, Bld: 140 mg/dL — ABNORMAL HIGH (ref 70–99)
Potassium: 3.4 mmol/L — ABNORMAL LOW (ref 3.5–5.1)
Sodium: 135 mmol/L (ref 135–145)

## 2022-03-20 LAB — URINE CULTURE: Culture: <10000 — AB

## 2022-03-20 MED ORDER — ACETAMINOPHEN 500 MG PO TABS
1000.0000 mg | ORAL_TABLET | Freq: Four times a day (QID) | ORAL | Status: DC
  Administered 2022-03-21 – 2022-03-24 (×9): 1000 mg via ORAL
  Filled 2022-03-20 (×11): qty 2

## 2022-03-20 MED ORDER — IPRATROPIUM-ALBUTEROL 0.5-2.5 (3) MG/3ML IN SOLN
3.0000 mL | RESPIRATORY_TRACT | Status: DC
  Administered 2022-03-20 – 2022-03-22 (×12): 3 mL via RESPIRATORY_TRACT
  Filled 2022-03-20 (×13): qty 3

## 2022-03-20 MED ORDER — OXYCODONE HCL 5 MG PO TABS
5.0000 mg | ORAL_TABLET | ORAL | Status: DC | PRN
  Administered 2022-03-20 – 2022-03-24 (×12): 10 mg via ORAL
  Filled 2022-03-20 (×12): qty 2

## 2022-03-20 NOTE — Progress Notes (Signed)
SATURATION QUALIFICATIONS: (This note is used to comply with regulatory documentation for home oxygen) ? ?Patient Saturations on Room Air at Rest = 86% ? ?Patient Saturations on Room Air while Ambulating = N/A, SpO2 86% on RA at rest ? ?Patient Saturations on 4 Liters of oxygen while Ambulating = 95% ? ?Please briefly explain why patient needs home oxygen:To maintain oxygen saturation > 90% at rest and with activity. ? ?Lillia Pauls, PT, DPT ?Acute Rehabilitation Services ?Pager [phone] ?Office [phone] ? ?
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
## 2022-03-21 LAB — BASIC METABOLIC PANEL
Anion gap: 11 (ref 5–15)
BUN: 5 mg/dL — ABNORMAL LOW (ref 6–20)
CO2: 27 mmol/L (ref 22–32)
Calcium: 8.5 mg/dL — ABNORMAL LOW (ref 8.9–10.3)
Chloride: 95 mmol/L — ABNORMAL LOW (ref 98–111)
Creatinine, Ser: 0.68 mg/dL (ref 0.61–1.24)
GFR, Estimated: >60 mL/min (ref >60)
Glucose, Bld: 137 mg/dL — ABNORMAL HIGH (ref 70–99)
Potassium: 3.7 mmol/L (ref 3.5–5.1)
Sodium: 133 mmol/L — ABNORMAL LOW (ref 135–145)

## 2022-03-21 LAB — CBC
HCT: 31.6 % — ABNORMAL LOW (ref 39.0–52.0)
Hemoglobin: 10.2 g/dL — ABNORMAL LOW (ref 13.0–17.0)
MCH: 29.8 pg (ref 26.0–34.0)
MCHC: 32.3 g/dL (ref 30.0–36.0)
MCV: 92.4 fL (ref 80.0–100.0)
Platelets: 71 10*3/uL — ABNORMAL LOW (ref 150–400)
RBC: 3.42 MIL/uL — ABNORMAL LOW (ref 4.22–5.81)
RDW: 19.6 % — ABNORMAL HIGH (ref 11.5–15.5)
WBC: 9.9 10*3/uL (ref 4.0–10.5)
nRBC: 0 % (ref 0.0–0.2)

## 2022-03-21 LAB — MAGNESIUM: Magnesium: 1.9 mg/dL (ref 1.7–2.4)

## 2022-03-21 LAB — BLOOD GAS, ARTERIAL
Acid-Base Excess: 8.2 mmol/L — ABNORMAL HIGH (ref 0.0–2.0)
Bicarbonate: 32.8 mmol/L — ABNORMAL HIGH (ref 20.0–28.0)
Drawn by: 336832
O2 Saturation: 96.5 %
Patient temperature: 36.7
pCO2 arterial: 43 mmHg (ref 32–48)
pH, Arterial: 7.49 — ABNORMAL HIGH (ref 7.35–7.45)
pO2, Arterial: 67 mmHg — ABNORMAL LOW (ref 83–108)

## 2022-03-21 LAB — PHOSPHORUS: Phosphorus: 3.4 mg/dL (ref 2.5–4.6)
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
## 2022-03-21 NOTE — TOC Progression Note (Addendum)
Transition of Care (TOC) - Progression Note  ? ? ?Patient Details  ?Name: Phillip Juarez ?MRN: [id number] ?Date of Birth: [birth year] ? ?Transition of Care (TOC) CM/SW Contact  ?Glennon Mac, RN ?Phone Number: ?03/21/2022, 3:23 PM ? ?Clinical Narrative:    ?Patient nearing medical stability within the next 1 to 2 days, per provider.  Left message for Teena at [facility] to confirm potential bed offer.  Will follow with updates as available. ? ?Addendum: 4:01 PM spoke with Randa Lynn at [facility]; she is extending bed offer for patient to admit to facility pending insurance authorization.  Spoke with patient's sister, [name]: She states she will look up reviews for facility and speak with her sister before making a decision.  Will follow-up with [name] in a.m. ? ? ?Expected Discharge Plan: Skilled Nursing Facility ?Barriers to Discharge: Continued Medical Work up, SNF Pending bed offer ? ?Expected Discharge Plan and Services ?Expected Discharge Plan: Skilled Nursing Facility ?  ?Discharge Planning Services: CM Consult ?  ?Living arrangements for the past 2 months: Single Family Home ?Expected Discharge Date: 03/20/22               ?  ?  ?  ?  ?  ?  ?  ?  ?  ?  ? ? ?Social Determinants of Health (SDOH) Interventions ?  ? ?Readmission Risk Interventions ?   ? View : No data to display.  ?  ?  ?  ? ?Quintella Baton, RN, BSN  ?Trauma/Neuro ICU Case Manager ?[phone] ? ?

## 2022-03-21 NOTE — Progress Notes (Signed)
?   03/21/22 1116  ?Assess: MEWS Score  ?Temp (!) 100.5 ?F (38.1 ?C)  ?BP 130/74  ?Pulse Rate (!) 105  ?ECG Heart Rate (!) 107  ?Resp 16  ?SpO2 95 %  ?O2 Device Nasal Cannula  ?O2 Flow Rate (L/min) 4 L/min  ?Assess: MEWS Score  ?MEWS Temp 1  ?MEWS Systolic 0  ?MEWS Pulse 1  ?MEWS RR 0  ?MEWS LOC 0  ?MEWS Score 2  ?MEWS Score Color Yellow  ?Assess: if the MEWS score is Yellow or Red  ?Were vital signs taken at a resting state? Yes  ?Focused Assessment No change from prior assessment  ?Early Detection of Sepsis Score *See Row Information* Low  ?MEWS guidelines implemented *See Row Information* No, vital signs rechecked  ?Treat  ?MEWS Interventions Administered scheduled meds/treatments  ?Notify: Charge Nurse/RN  ?Name of Charge Nurse/RN Notified Vernona Rieger, RN  ?Date Charge Nurse/RN Notified 03/21/22  ?Time Charge Nurse/RN Notified 1126  ?Notify: Provider  ?Provider Name/Title Bedelia Person and PA, Tresa Endo  ?Date Provider Notified 03/21/22  ?Time Provider Notified 1126  ?Method of Notification Page  ?Notification Reason Other (Comment) ?(Temperature)  ?Provider response No new orders  ?Date of Provider Response 03/21/22  ?Time of Provider Response 1156  ?Document  ?Patient Outcome Other (Comment) ?(vitals re-checked)  ?Progress note created (see row info) Yes  ? ? ?

## 2022-03-21 NOTE — Progress Notes (Signed)
?   03/21/22 1607  ?Assess: MEWS Score  ?Temp 97.7 ?F (36.5 ?C)  ?BP 124/69  ?Pulse Rate (!) 103  ?ECG Heart Rate (!) 103  ?Resp 12  ?SpO2 96 %  ?Assess: MEWS Score  ?MEWS Temp 0  ?MEWS Systolic 0  ?MEWS Pulse 1  ?MEWS RR 1  ?MEWS LOC 0  ?MEWS Score 2  ?MEWS Score Color Yellow  ?Assess: if the MEWS score is Yellow or Red  ?Were vital signs taken at a resting state? Yes  ?Focused Assessment No change from prior assessment  ?Early Detection of Sepsis Score *See Row Information* Low  ?MEWS guidelines implemented *See Row Information* No, vital signs rechecked  ?Document  ?Progress note created (see row info) Yes  ? ? ?
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
## 2022-03-21 NOTE — Progress Notes (Signed)
? ?   ? ?  RE:   Phillip Juarez     ?Date of Birth:  09-24-1966    ?Date:   03/21/2022    ? ? ?To Whom It May Concern: ? ?Please be advised that the above-named patient will require a short-term nursing home stay - anticipated 30 days or less for rehabilitation and strengthening.  The plan is for return home. ? ? ?

## 2022-03-22 ENCOUNTER — Inpatient Hospital Stay (HOSPITAL_COMMUNITY): Payer: Medicaid Other

## 2022-03-22 LAB — CBC
HCT: 28.4 % — ABNORMAL LOW (ref 39.0–52.0)
Hemoglobin: 9.5 g/dL — ABNORMAL LOW (ref 13.0–17.0)
MCH: 30.8 pg (ref 26.0–34.0)
MCHC: 33.5 g/dL (ref 30.0–36.0)
MCV: 92.2 fL (ref 80.0–100.0)
Platelets: 81 10*3/uL — ABNORMAL LOW (ref 150–400)
RBC: 3.08 MIL/uL — ABNORMAL LOW (ref 4.22–5.81)
RDW: 19.9 % — ABNORMAL HIGH (ref 11.5–15.5)
WBC: 14.2 10*3/uL — ABNORMAL HIGH (ref 4.0–10.5)
nRBC: 0 % (ref 0.0–0.2)

## 2022-03-22 LAB — URINALYSIS, ROUTINE W REFLEX MICROSCOPIC
Bilirubin Urine: NEGATIVE
Glucose, UA: NEGATIVE mg/dL
Hgb urine dipstick: NEGATIVE
Ketones, ur: NEGATIVE mg/dL
Leukocytes,Ua: NEGATIVE
Nitrite: NEGATIVE
Protein, ur: NEGATIVE mg/dL
Specific Gravity, Urine: 1.017 (ref 1.005–1.030)
pH: 5 (ref 5.0–8.0)

## 2022-03-22 MED ORDER — LEVOFLOXACIN 750 MG PO TABS
750.0000 mg | ORAL_TABLET | Freq: Every day | ORAL | Status: DC
  Administered 2022-03-22 – 2022-03-24 (×3): 750 mg via ORAL
  Filled 2022-03-22: qty 1
  Filled 2022-03-22: qty 2
  Filled 2022-03-22: qty 1

## 2022-03-22 MED ORDER — LORAZEPAM 2 MG/ML IJ SOLN
1.0000 mg | INTRAMUSCULAR | Status: AC | PRN
  Administered 2022-03-25 (×2): 2 mg via INTRAVENOUS
  Administered 2022-03-25: 4 mg via INTRAVENOUS
  Filled 2022-03-22 (×2): qty 1
  Filled 2022-03-22 (×2): qty 2

## 2022-03-22 MED ORDER — LORAZEPAM 1 MG PO TABS: 1.0000 mg | ORAL_TABLET | ORAL | Status: AC | PRN

## 2022-03-22 MED ORDER — IPRATROPIUM-ALBUTEROL 0.5-2.5 (3) MG/3ML IN SOLN
3.0000 mL | Freq: Three times a day (TID) | RESPIRATORY_TRACT | Status: DC
  Administered 2022-03-23: 3 mL via RESPIRATORY_TRACT
  Filled 2022-03-22: qty 3
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
## 2022-03-22 NOTE — Progress Notes (Signed)
?  Mobility Specialist Criteria Algorithm Info. ? ? ? 03/22/22 1005  ?Mobility  ?Activity Ambulated with assistance in room; Ambulated to bathroom  ?Range of Motion/Exercises Active;All extremities  ?Level of Assistance Contact guard assist, steadying assist ?(min A EOB)  ?Assistive Device Front wheel walker  ?Distance Ambulated (ft) 20 ft  ?Activity Response Tolerated well  ? ?Patient received in supine agreeable to participate in mobility. Required minimal HHA to EOB from supine>sit + cues to use hand rails on bed. Upon dangling EOB requested assistance to restroom. Ambulated in room min guard with slow gait. Deferred further ambulation after using restroom. Was left recliner chair with all needs met, call bell in reach.  ? ?03/22/2022 ?3:36 PM ? ?[place] Shihab States, CMS, BS EXP ?Acute Rehabilitation Services  ?SWNIO:[phone] ?Office: [phone] ? ?
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
## 2022-03-22 NOTE — Plan of Care (Signed)
  Problem: Education: Goal: Knowledge of General Education information will improve Description Including pain rating scale, medication(s)/side effects and non-pharmacologic comfort measures Outcome: Progressing   Problem: Health Behavior/Discharge Planning: Goal: Ability to manage health-related needs will improve Outcome: Progressing   

## 2022-03-22 NOTE — Plan of Care (Signed)
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
## 2022-03-23 ENCOUNTER — Inpatient Hospital Stay (HOSPITAL_COMMUNITY): Payer: Medicaid Other

## 2022-03-23 MED ORDER — IPRATROPIUM-ALBUTEROL 0.5-2.5 (3) MG/3ML IN SOLN
3.0000 mL | Freq: Two times a day (BID) | RESPIRATORY_TRACT | Status: DC
  Administered 2022-03-23 – 2022-03-24 (×3): 3 mL via RESPIRATORY_TRACT
  Filled 2022-03-23 (×3): qty 3

## 2022-03-23 NOTE — Plan of Care (Signed)
  Problem: Education: Goal: Knowledge of General Education information will improve Description Including pain rating scale, medication(s)/side effects and non-pharmacologic comfort measures Outcome: Progressing   Problem: Nutrition: Goal: Adequate nutrition will be maintained Outcome: Progressing   Problem: Coping: Goal: Level of anxiety will decrease Outcome: Progressing   Problem: Elimination: Goal: Will not experience complications related to bowel motility Outcome: Progressing Goal: Will not experience complications related to urinary retention Outcome: Progressing   Problem: Safety: Goal: Ability to remain free from injury will improve Outcome: Progressing   Problem: Skin Integrity: Goal: Risk for impaired skin integrity will decrease Outcome: Progressing   

## 2022-03-23 NOTE — Progress Notes (Addendum)
?  Mobility Specialist Criteria Algorithm Info. ? ? ? 03/23/22 1035  ?Pain Assessment  ?Pain Assessment 0-10  ?Pain Score 6  ?Pain Location L Flank/Oblique, Lower Back  ?Pain Descriptors / Indicators Discomfort;Grimacing;Sharp;Spasm  ?Pain Intervention(s) Limited activity within patient's tolerance  ?Mobility  ?Activity Ambulated with assistance in hallway  ?Range of Motion/Exercises Active;All extremities  ?Level of Assistance Contact guard assist, steadying assist  ?Assistive Device Front wheel walker  ?Distance Ambulated (ft) 240 ft  ?Activity Response Tolerated well  ? ?Patient received in supine eager and motivated to participate in mobility. Still limited by pain, SOB and weakness. Was able to get to EOB from supine>sit without assistance this session. Stood and ambulated in hallway min guard with slow steady gait. Tolerated ambulating on 4LO2 but unable to get consistent reading on pulse oximeter. Returned to room without complaint or incident. Was left bed with all needs met, call bell in reach.  ? ?03/23/2022 ?11:28 AM ? ?[place] Legend Pecore, CMS, BS EXP ?Acute Rehabilitation Services  ?FX[facility]:[phone] ?Office: [phone] ? ?
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
## 2022-03-23 NOTE — TOC Progression Note (Addendum)
Transition of Care (TOC) - Progression Note  ? ? ?Patient Details  ?Name: Phillip Juarez ?MRN: [id number] ?Date of Birth: 1966/04/10 ? ?Transition of Care (TOC) CM/SW Contact  ?Lockie Pares, RN ?Phone Number: ?03/23/2022, 11:26 AM ? ?Clinical Narrative:    ?Called and spoke to Clydie Braun at [facility] to see if they  will accept. Clydie Braun at admissions  referred me to [facility]  [phone]. Revonda Standard will call back, double checking with the business office, but she believes they can accept after a certain amount of days.  She will follow up with this RNCM ? ?[address] called back. Stated she can take insurance,, she can start authorization when patient clearly out of DT zone. ( Give her until next week) Reported this to fellow RNCM J Amerson.Marland Kitchen ?Expected Discharge Plan: Skilled Nursing Facility ?Barriers to Discharge: Continued Medical Work up, SNF Pending bed offer ? ?Expected Discharge Plan and Services ?Expected Discharge Plan: Skilled Nursing Facility ?  ?Discharge Planning Services: CM Consult ?  ?Living arrangements for the past 2 months: Single Family Home ?Expected Discharge Date: 03/20/22               ?  ?  ?  ?  ?  ?  ?  ?  ?  ?  ? ? ?Social Determinants of Health (SDOH) Interventions ?  ? ?Readmission Risk Interventions ?   ? View : No data to display.  ?  ?  ?  ? ? ?

## 2022-03-23 NOTE — TOC Progression Note (Addendum)
Transition of Care (TOC) - Progression Note  ? ? ?Patient Details  ?Name: Phillip Juarez ?MRN: [id number] ?Date of Birth: May 22, 1966 ? ?Transition of Care (TOC) CM/SW Contact  ?Glennon Mac, RN ?Phone Number: ?03/23/2022, 4:05 PM ? ?Clinical Narrative:    ?Family prefers [facility], and facility willing to admit patient, but he must be at least 7 days out from admission, due to alcohol history and possibility of withdrawal issues.  Spoke with Revonda Standard in admissions at facility; she states she will plan to start insurance authorization on Monday.  Updated patient's sister, [name] on placement. ? ? ?Expected Discharge Plan: Skilled Nursing Facility ?Barriers to Discharge: Continued Medical Work up, SNF Pending bed offer ? ?Expected Discharge Plan and Services ?Expected Discharge Plan: Skilled Nursing Facility ?  ?Discharge Planning Services: CM Consult ?  ?Living arrangements for the past 2 months: Single Family Home ?Expected Discharge Date: 03/20/22               ?  ?  ?  ?  ?  ?  ?  ?  ?  ?  ? ? ?Social Determinants of Health (SDOH) Interventions ?  ? ?Readmission Risk Interventions ?   ? View : No data to display.  ?  ?  ?  ? ?Quintella Baton, RN, BSN  ?Trauma/Neuro ICU Case Manager ?[phone] ? ?

## 2022-03-23 NOTE — Progress Notes (Signed)
PT Cancellation Note ? ?Patient Details ?Name: Phillip Juarez ?MRN: [id number] ?DOB: [birth year] ? ? ?Cancelled Treatment:    Reason Eval/Treat Not Completed: Other (comment). Pt reported he was too tired after amb earlier with mobility and transferring to a different unit. Will continue to follow.  ? ? ?Angelina Ok [facility] ?03/23/2022, 2:51 PM ?Skip Mayer PT ?Acute Rehabilitation Services ?Office [phone] ? ?

## 2022-03-24 ENCOUNTER — Inpatient Hospital Stay (HOSPITAL_COMMUNITY): Payer: Medicaid Other

## 2022-03-24 DIAGNOSIS — Z515 Encounter for palliative care: Secondary | ICD-10-CM

## 2022-03-24 DIAGNOSIS — E440 Moderate protein-calorie malnutrition: Secondary | ICD-10-CM | POA: Insufficient documentation

## 2022-03-24 DIAGNOSIS — F1020 Alcohol dependence, uncomplicated: Secondary | ICD-10-CM

## 2022-03-24 DIAGNOSIS — D696 Thrombocytopenia, unspecified: Secondary | ICD-10-CM

## 2022-03-24 DIAGNOSIS — J942 Hemothorax: Secondary | ICD-10-CM | POA: Diagnosis not present

## 2022-03-24 LAB — POCT I-STAT 7, (LYTES, BLD GAS, ICA,H+H)
Acid-Base Excess: 6 mmol/L — ABNORMAL HIGH (ref 0.0–2.0)
Bicarbonate: 29.5 mmol/L — ABNORMAL HIGH (ref 20.0–28.0)
Calcium, Ion: 1.09 mmol/L — ABNORMAL LOW (ref 1.15–1.40)
HCT: 27 % — ABNORMAL LOW (ref 39.0–52.0)
Hemoglobin: 9.2 g/dL — ABNORMAL LOW (ref 13.0–17.0)
O2 Saturation: 96 %
Patient temperature: 98.6
Potassium: 3.7 mmol/L (ref 3.5–5.1)
Sodium: 132 mmol/L — ABNORMAL LOW (ref 135–145)
TCO2: 31 mmol/L (ref 22–32)
pCO2 arterial: 38.9 mmHg (ref 32–48)
pH, Arterial: 7.488 — ABNORMAL HIGH (ref 7.35–7.45)
pO2, Arterial: 77 mmHg — ABNORMAL LOW (ref 83–108)

## 2022-03-24 LAB — BLOOD GAS, ARTERIAL
Acid-Base Excess: 6.6 mmol/L — ABNORMAL HIGH (ref 0.0–2.0)
Bicarbonate: 32.4 mmol/L — ABNORMAL HIGH (ref 20.0–28.0)
O2 Saturation: 43.7 %
Patient temperature: 37
pCO2 arterial: 50 mmHg — ABNORMAL HIGH (ref 32–48)
pH, Arterial: 7.42 (ref 7.35–7.45)
pO2, Arterial: <31 mmHg — CL (ref 83–108)

## 2022-03-24 LAB — CBC
HCT: 24.9 % — ABNORMAL LOW (ref 39.0–52.0)
Hemoglobin: 7.9 g/dL — ABNORMAL LOW (ref 13.0–17.0)
MCH: 29.7 pg (ref 26.0–34.0)
MCHC: 31.7 g/dL (ref 30.0–36.0)
MCV: 93.6 fL (ref 80.0–100.0)
Platelets: 153 10*3/uL (ref 150–400)
RBC: 2.66 MIL/uL — ABNORMAL LOW (ref 4.22–5.81)
RDW: 20.2 % — ABNORMAL HIGH (ref 11.5–15.5)
WBC: 18.8 10*3/uL — ABNORMAL HIGH (ref 4.0–10.5)
nRBC: 0 % (ref 0.0–0.2)

## 2022-03-24 LAB — COMPREHENSIVE METABOLIC PANEL
ALT: 18 U/L (ref 0–44)
AST: 41 U/L (ref 15–41)
Albumin: 2.3 g/dL — ABNORMAL LOW (ref 3.5–5.0)
Alkaline Phosphatase: 34 U/L — ABNORMAL LOW (ref 38–126)
Anion gap: 9 (ref 5–15)
BUN: 11 mg/dL (ref 6–20)
CO2: 28 mmol/L (ref 22–32)
Calcium: 8.2 mg/dL — ABNORMAL LOW (ref 8.9–10.3)
Chloride: 95 mmol/L — ABNORMAL LOW (ref 98–111)
Creatinine, Ser: 0.69 mg/dL (ref 0.61–1.24)
GFR, Estimated: >60 mL/min (ref >60)
Glucose, Bld: 111 mg/dL — ABNORMAL HIGH (ref 70–99)
Potassium: 3.8 mmol/L (ref 3.5–5.1)
Sodium: 132 mmol/L — ABNORMAL LOW (ref 135–145)
Total Bilirubin: 2.3 mg/dL — ABNORMAL HIGH (ref 0.3–1.2)
Total Protein: 6.4 g/dL — ABNORMAL LOW (ref 6.5–8.1)

## 2022-03-24 MED ORDER — CALCIUM CARBONATE ANTACID 500 MG PO CHEW: 1.0000 | CHEWABLE_TABLET | Freq: Three times a day (TID) | ORAL | Status: DC | PRN

## 2022-03-24 MED ORDER — FENTANYL 2500MCG IN NS 250ML (10MCG/ML) PREMIX INFUSION: 50.0000 ug/h | INTRAVENOUS | Status: DC

## 2022-03-24 MED ORDER — GABAPENTIN 300 MG PO CAPS
300.0000 mg | ORAL_CAPSULE | Freq: Three times a day (TID) | ORAL | Status: DC
  Administered 2022-03-24 – 2022-04-09 (×50): 300 mg
  Filled 2022-03-24 (×50): qty 1

## 2022-03-24 MED ORDER — LIDOCAINE HCL 1 % IJ SOLN
INTRAMUSCULAR | Status: AC
Start: 2022-03-24 — End: 2022-03-24
  Filled 2022-03-24: qty 20

## 2022-03-24 MED ORDER — DOCUSATE SODIUM 50 MG/5ML PO LIQD
100.0000 mg | Freq: Two times a day (BID) | ORAL | Status: DC
  Filled 2022-03-24: qty 10

## 2022-03-24 MED ORDER — THIAMINE HCL 100 MG PO TABS
100.0000 mg | ORAL_TABLET | Freq: Every day | ORAL | Status: DC
  Administered 2022-03-29 – 2022-04-09 (×10): 100 mg
  Filled 2022-03-24 (×11): qty 1

## 2022-03-24 MED ORDER — SODIUM CHLORIDE 0.9 % IV SOLN
2.0000 g | Freq: Three times a day (TID) | INTRAVENOUS | Status: AC
  Administered 2022-03-25 – 2022-04-01 (×21): 2 g via INTRAVENOUS
  Filled 2022-03-24 (×21): qty 12.5

## 2022-03-24 MED ORDER — AMITRIPTYLINE HCL 25 MG PO TABS
50.0000 mg | ORAL_TABLET | Freq: Every evening | ORAL | Status: DC
  Administered 2022-03-24 – 2022-04-09 (×17): 50 mg
  Filled 2022-03-24 (×17): qty 2

## 2022-03-24 MED ORDER — FENTANYL CITRATE PF 50 MCG/ML IJ SOSY: 50.0000 ug | PREFILLED_SYRINGE | Freq: Once | INTRAMUSCULAR | Status: DC

## 2022-03-24 MED ORDER — ALPRAZOLAM 0.25 MG PO TABS
1.0000 mg | ORAL_TABLET | Freq: Two times a day (BID) | ORAL | Status: DC
  Administered 2022-03-24 – 2022-04-09 (×33): 1 mg
  Filled 2022-03-24: qty 2
  Filled 2022-03-24 (×2): qty 4
  Filled 2022-03-24 (×11): qty 2
  Filled 2022-03-24 (×2): qty 4
  Filled 2022-03-24 (×5): qty 2
  Filled 2022-03-24 (×2): qty 4
  Filled 2022-03-24 (×5): qty 2
  Filled 2022-03-24: qty 4
  Filled 2022-03-24 (×4): qty 2

## 2022-03-24 MED ORDER — MIRTAZAPINE 15 MG PO TABS
30.0000 mg | ORAL_TABLET | Freq: Every day | ORAL | Status: DC
  Administered 2022-03-24 – 2022-04-17 (×25): 30 mg
  Filled 2022-03-24 (×25): qty 2

## 2022-03-24 MED ORDER — POLYETHYLENE GLYCOL 3350 17 G PO PACK
17.0000 g | PACK | Freq: Every day | ORAL | Status: DC
  Filled 2022-03-24: qty 1

## 2022-03-24 MED ORDER — PANTOPRAZOLE SODIUM 40 MG PO TBEC: 40.0000 mg | DELAYED_RELEASE_TABLET | Freq: Every day | ORAL | Status: DC

## 2022-03-24 MED ORDER — FOLIC ACID 1 MG PO TABS
1.0000 mg | ORAL_TABLET | Freq: Every day | ORAL | Status: DC
  Administered 2022-03-25 – 2022-04-09 (×16): 1 mg
  Filled 2022-03-24 (×16): qty 1

## 2022-03-24 MED ORDER — THIAMINE HCL 100 MG/ML IJ SOLN
100.0000 mg | Freq: Every day | INTRAMUSCULAR | Status: DC
  Administered 2022-03-25 – 2022-04-02 (×6): 100 mg via INTRAVENOUS
  Filled 2022-03-24 (×6): qty 2

## 2022-03-24 MED ORDER — PANTOPRAZOLE 2 MG/ML SUSPENSION
40.0000 mg | Freq: Every day | ORAL | Status: DC
  Administered 2022-03-24 – 2022-03-30 (×7): 40 mg
  Filled 2022-03-24 (×8): qty 20

## 2022-03-24 MED ORDER — OXYCODONE HCL 5 MG PO TABS
5.0000 mg | ORAL_TABLET | ORAL | Status: DC | PRN
  Administered 2022-03-25 – 2022-03-29 (×3): 10 mg
  Administered 2022-03-29: 5 mg
  Administered 2022-03-31 – 2022-04-09 (×20): 10 mg
  Filled 2022-03-24 (×9): qty 2
  Filled 2022-03-24: qty 1
  Filled 2022-03-24 (×6): qty 2
  Filled 2022-03-24: qty 1
  Filled 2022-03-24 (×4): qty 2
  Filled 2022-03-24: qty 1
  Filled 2022-03-24 (×5): qty 2

## 2022-03-24 MED ORDER — FUROSEMIDE 10 MG/ML IJ SOLN
80.0000 mg | Freq: Once | INTRAMUSCULAR | Status: AC
  Administered 2022-03-24: 80 mg via INTRAVENOUS
  Filled 2022-03-24: qty 8

## 2022-03-24 MED ORDER — PEG 3350-KCL-NA BICARB-NACL 420 G PO SOLR
2000.0000 mL | Freq: Once | ORAL | Status: AC
  Administered 2022-03-24: 2000 mL
  Filled 2022-03-24: qty 4000

## 2022-03-24 MED ORDER — FENTANYL BOLUS VIA INFUSION
50.0000 ug | INTRAVENOUS | Status: DC | PRN
  Filled 2022-03-24: qty 100

## 2022-03-24 MED ORDER — METHOCARBAMOL 500 MG PO TABS
1000.0000 mg | ORAL_TABLET | Freq: Four times a day (QID) | ORAL | Status: DC
  Administered 2022-03-24 – 2022-04-09 (×65): 1000 mg
  Filled 2022-03-24 (×65): qty 2

## 2022-03-24 MED ORDER — CHLORHEXIDINE GLUCONATE CLOTH 2 % EX PADS
6.0000 | MEDICATED_PAD | Freq: Every day | CUTANEOUS | Status: DC
  Administered 2022-03-24 – 2022-04-10 (×17): 6 via TOPICAL

## 2022-03-24 MED ORDER — IOHEXOL 350 MG/ML SOLN
100.0000 mL | Freq: Once | INTRAVENOUS | Status: AC | PRN
  Administered 2022-03-24: 100 mL via INTRAVENOUS

## 2022-03-24 MED ORDER — FUROSEMIDE 20 MG PO TABS
20.0000 mg | ORAL_TABLET | Freq: Every day | ORAL | Status: DC
  Administered 2022-03-25 – 2022-04-09 (×16): 20 mg
  Filled 2022-03-24 (×16): qty 1

## 2022-03-24 MED ORDER — SORBITOL 70 % SOLN
960.0000 mL | TOPICAL_OIL | Freq: Once | ORAL | Status: AC
  Administered 2022-03-24: 960 mL via RECTAL
  Filled 2022-03-24: qty 473

## 2022-03-24 MED ORDER — ADULT MULTIVITAMIN W/MINERALS CH
1.0000 | ORAL_TABLET | Freq: Every day | ORAL | Status: DC
Start: 2022-03-25 — End: 2022-04-10
  Administered 2022-03-25 – 2022-04-09 (×16): 1
  Filled 2022-03-24 (×16): qty 1

## 2022-03-24 MED ORDER — ACETAMINOPHEN 500 MG PO TABS
1000.0000 mg | ORAL_TABLET | Freq: Four times a day (QID) | ORAL | Status: DC
  Administered 2022-03-24 – 2022-04-10 (×60): 1000 mg
  Filled 2022-03-24 (×61): qty 2

## 2022-03-24 NOTE — Progress Notes (Signed)
PT Cancellation Note ? ?Patient Details ?Name: Phillip Juarez ?MRN: [id number] ?DOB: 02-Jan-1966 ? ? ?Cancelled Treatment:    Reason Eval/Treat Not Completed: Medical issues which prohibited therapy, pt transferred off floor. Will check back as schedule allows to continue with PT POC. ? ? ? ?Marlana Salvage [name] ?03/24/2022, 11:53 AM ?

## 2022-03-24 NOTE — Progress Notes (Signed)
Pt O2 sat 85% on previous 6L [place]. Increased to 10L  by PA at bedside.  ?Changed to simple mask d/t pt report of nasal dryness and congestion. Pt remains on 8L humidified O2 via simple mask; SpO2 97%. Pt remains alert to voice and oriented at baseline. Respirations remain unlabored and equal. RR 18 ?
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
## 2022-03-24 NOTE — Progress Notes (Signed)
Patient's CXR shows worsening effusion.  He was taken to IR but no fluid was noted, just consolidation.  At the time same, I was called about possible free air on his plain abdominal x-ray as well as a stat critical lab value of P02 less than 30%.  Patient remains on 10L simple mask currently.  He was immediately sent from IR to the CT scanner for CT CAP.  No evidence of free air was noted in his abdomen, but his CT of his chest showed B infiltrates and significant consolidation by personal review.  Given the constellation of findings as noted above, he has been transferred to the ICU and is currently awaiting intubation, but after discussion with the sister, she would like hold on intubation until she can come and speak to him to help determine what he wants.  We will also plan to place a Cortrak and give him some Golytely down his tube for significant constipation.  We will move forward with a smog enema as well.  Letha Cape 12:04 PM 03/24/2022

## 2022-03-24 NOTE — Progress Notes (Signed)
Patient ID: Phillip Juarez, male   DOB: October 09, 1966, 56 y.o.   MRN: [id number] ?Appreciate Palliative Care. I met with his two sisters, one of whom is his healthcare POA, to discuss [place]. I discussed his B PNA and respiratory failure as well as his baseline cirrhosis. I discussed what intubation/mechanical ventilation would entail and that he may not be able to wean from the ventilator. He might require tracheostomy and may not get back to being able to care for himself. We then discussed this with [name] and asked him about his wishes. He says he wants to try everything to get better for a month. He does not want to be DNR and he is willing to undergo intubation if his respiratory status worsens. Will proceed with CorTrak placement now and watch closely. ? ?Georganna Skeans, MD, MPH, FACS ?Please use [email or web address] to contact on call provider ? ? ? ?

## 2022-03-24 NOTE — Progress Notes (Signed)
Report called to [facility] on 4N. Pt to be transferred from radiology to 4N28. ?
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
## 2022-03-24 NOTE — Progress Notes (Signed)
Pt in IR for thoracentesis at this time.  ?Per radiologist Dr. Tyron Russell, additional orders needed from Trauma PA or MD. ?Relayed message and call back number for Dr Tyron Russell to Barnetta Chapel PA. ?Critical PO2 of <31 also relayed to PA at this time. ?
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
## 2022-03-24 NOTE — Progress Notes (Signed)
SLP Cancellation Note ? ?Patient Details ?Name: Phillip Juarez ?MRN: [id number] ?DOB: [birth year] ? ? ?Cancelled treatment:       Reason Eval/Treat Not Completed: Medical issues which prohibited therapy. Pt transferred to ICU, considering intubation. Will follow up as able. ? ? ? ?Mahala Menghini., M.A. CCC-SLP ?Acute Rehabilitation Services ?Office [phone] ? ?Secure chat preferred ? ?03/24/2022, 2:53 PM ?

## 2022-03-24 NOTE — Procedures (Signed)
Cortrak ? ?Person Inserting Tube:  Asencion Islam, RD ?Tube Type:  Cortrak - 43 inches ?Tube Size:  10 ?Tube Location:  Right nare ?Secured by: Dorann Lodge ?Technique Used to Measure Tube Placement:  Marking at nare/corner of mouth ?Cortrak Secured At:  66 cm ? ?Cortrak Tube Team Note: ? ?Consult received to place a Cortrak feeding tube.  ?Noted that pt has a sore place on the inside of his L nare. RN is aware. Encouraged pt to reposition his tube as needed so that the bridle tubing does not lay on his sore for long periods of time.  ? ?X-ray is required, abdominal x-ray has been ordered by the Cortrak team. Please confirm tube placement before using the Cortrak tube.  ? ?If the tube becomes dislodged please keep the tube and contact the Cortrak team at [email or web address] (password [email or web address]) for replacement.  ?If after hours and replacement cannot be delayed, place a NG tube and confirm placement with an abdominal x-ray.  ? ?Lockie Pares., RD, LDN, CNSC ?See AMiON for contact information  ? ? ?

## 2022-03-25 LAB — POCT I-STAT 7, (LYTES, BLD GAS, ICA,H+H)
Acid-Base Excess: 7 mmol/L — ABNORMAL HIGH (ref 0.0–2.0)
Bicarbonate: 32.3 mmol/L — ABNORMAL HIGH (ref 20.0–28.0)
Calcium, Ion: 1.09 mmol/L — ABNORMAL LOW (ref 1.15–1.40)
HCT: 25 % — ABNORMAL LOW (ref 39.0–52.0)
Hemoglobin: 8.5 g/dL — ABNORMAL LOW (ref 13.0–17.0)
O2 Saturation: 97 %
Patient temperature: 98.9
Potassium: 3.5 mmol/L (ref 3.5–5.1)
Sodium: 136 mmol/L (ref 135–145)
TCO2: 34 mmol/L — ABNORMAL HIGH (ref 22–32)
pCO2 arterial: 49.7 mmHg — ABNORMAL HIGH (ref 32–48)
pH, Arterial: 7.421 (ref 7.35–7.45)
pO2, Arterial: 95 mmHg (ref 83–108)

## 2022-03-25 LAB — CBC
HCT: 25 % — ABNORMAL LOW (ref 39.0–52.0)
Hemoglobin: 8.2 g/dL — ABNORMAL LOW (ref 13.0–17.0)
MCH: 30.4 pg (ref 26.0–34.0)
MCHC: 32.8 g/dL (ref 30.0–36.0)
MCV: 92.6 fL (ref 80.0–100.0)
Platelets: 156 10*3/uL (ref 150–400)
RBC: 2.7 MIL/uL — ABNORMAL LOW (ref 4.22–5.81)
RDW: 20 % — ABNORMAL HIGH (ref 11.5–15.5)
WBC: 15 10*3/uL — ABNORMAL HIGH (ref 4.0–10.5)
nRBC: 0 % (ref 0.0–0.2)

## 2022-03-25 LAB — BASIC METABOLIC PANEL
Anion gap: 7 (ref 5–15)
BUN: 13 mg/dL (ref 6–20)
CO2: 30 mmol/L (ref 22–32)
Calcium: 7.7 mg/dL — ABNORMAL LOW (ref 8.9–10.3)
Chloride: 97 mmol/L — ABNORMAL LOW (ref 98–111)
Creatinine, Ser: 0.72 mg/dL (ref 0.61–1.24)
GFR, Estimated: >60 mL/min (ref >60)
Glucose, Bld: 99 mg/dL (ref 70–99)
Potassium: 3.6 mmol/L (ref 3.5–5.1)
Sodium: 134 mmol/L — ABNORMAL LOW (ref 135–145)

## 2022-03-25 MED ORDER — POTASSIUM CHLORIDE 10 MEQ/100ML IV SOLN
10.0000 meq | INTRAVENOUS | Status: AC
  Administered 2022-03-25 (×2): 10 meq via INTRAVENOUS
  Filled 2022-03-25 (×2): qty 100

## 2022-03-25 MED ORDER — CHLORHEXIDINE GLUCONATE 0.12 % MT SOLN
15.0000 mL | Freq: Two times a day (BID) | OROMUCOSAL | Status: DC
  Administered 2022-03-25 – 2022-03-31 (×5): 15 mL via OROMUCOSAL
  Filled 2022-03-25 (×5): qty 15

## 2022-03-25 MED ORDER — ORAL CARE MOUTH RINSE
15.0000 mL | Freq: Two times a day (BID) | OROMUCOSAL | Status: DC
  Administered 2022-03-25 – 2022-03-27 (×6): 15 mL via OROMUCOSAL

## 2022-03-25 MED ORDER — ENOXAPARIN SODIUM 40 MG/0.4ML IJ SOSY
40.0000 mg | PREFILLED_SYRINGE | INTRAMUSCULAR | Status: DC
Start: 2022-03-25 — End: 2022-04-17
  Administered 2022-03-25 – 2022-04-16 (×23): 40 mg via SUBCUTANEOUS
  Filled 2022-03-25 (×24): qty 0.4

## 2022-03-25 MED ORDER — LORAZEPAM 2 MG/ML IJ SOLN
1.0000 mg | INTRAMUSCULAR | Status: AC | PRN
  Administered 2022-03-25 (×2): 1 mg via INTRAVENOUS
  Administered 2022-03-25 (×2): 4 mg via INTRAVENOUS
  Administered 2022-03-25 (×2): 1 mg via INTRAVENOUS
  Administered 2022-03-26: 2 mg via INTRAVENOUS
  Administered 2022-03-26: 1 mg via INTRAVENOUS
  Administered 2022-03-26: 2 mg via INTRAVENOUS
  Administered 2022-03-26 (×2): 1 mg via INTRAVENOUS
  Administered 2022-03-26: 4 mg via INTRAVENOUS
  Administered 2022-03-26: 1 mg via INTRAVENOUS
  Administered 2022-03-27 (×3): 2 mg via INTRAVENOUS
  Filled 2022-03-25: qty 1
  Filled 2022-03-25: qty 2
  Filled 2022-03-25 (×5): qty 1
  Filled 2022-03-25: qty 2
  Filled 2022-03-25 (×2): qty 1
  Filled 2022-03-25: qty 2
  Filled 2022-03-25: qty 1
  Filled 2022-03-25: qty 2
  Filled 2022-03-25 (×2): qty 1

## 2022-03-25 MED ORDER — LORAZEPAM 1 MG PO TABS: 1.0000 mg | ORAL_TABLET | ORAL | Status: AC | PRN

## 2022-03-25 MED ORDER — FUROSEMIDE 10 MG/ML IJ SOLN
20.0000 mg | Freq: Once | INTRAMUSCULAR | Status: AC
  Administered 2022-03-25: 20 mg via INTRAVENOUS
  Filled 2022-03-25: qty 2

## 2022-03-25 NOTE — Plan of Care (Signed)
  Problem: Education: Goal: Knowledge of General Education information will improve Description: Including pain rating scale, medication(s)/side effects and non-pharmacologic comfort measures Outcome: Progressing   Problem: Health Behavior/Discharge Planning: Goal: Ability to manage health-related needs will improve Outcome: Progressing   Problem: Clinical Measurements: Goal: Ability to maintain clinical measurements within normal limits will improve Outcome: Progressing   Problem: Activity: Goal: Risk for activity intolerance will decrease Outcome: Progressing   Problem: Nutrition: Goal: Adequate nutrition will be maintained Outcome: Progressing   Problem: Elimination: Goal: Will not experience complications related to bowel motility Outcome: Progressing   Problem: Pain Managment: Goal: General experience of comfort will improve Outcome: Progressing   Problem: Safety: Goal: Ability to remain free from injury will improve Outcome: Progressing   

## 2022-03-25 NOTE — Progress Notes (Signed)
Pt attempting to get OOB, pulling at lines and tubes. Dr. Janee Morn informed, and V.O., "to start non-violent 4 point restraints". ?
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
## 2022-03-26 ENCOUNTER — Inpatient Hospital Stay (HOSPITAL_COMMUNITY): Payer: Medicaid Other

## 2022-03-26 LAB — CBC
HCT: 25.4 % — ABNORMAL LOW (ref 39.0–52.0)
Hemoglobin: 8 g/dL — ABNORMAL LOW (ref 13.0–17.0)
MCH: 30.1 pg (ref 26.0–34.0)
MCHC: 31.5 g/dL (ref 30.0–36.0)
MCV: 95.5 fL (ref 80.0–100.0)
Platelets: 158 10*3/uL (ref 150–400)
RBC: 2.66 MIL/uL — ABNORMAL LOW (ref 4.22–5.81)
RDW: 19.9 % — ABNORMAL HIGH (ref 11.5–15.5)
WBC: 11.5 10*3/uL — ABNORMAL HIGH (ref 4.0–10.5)
nRBC: 0 % (ref 0.0–0.2)

## 2022-03-26 LAB — MAGNESIUM: Magnesium: 2.6 mg/dL — ABNORMAL HIGH (ref 1.7–2.4)

## 2022-03-26 LAB — BASIC METABOLIC PANEL
Anion gap: 8 (ref 5–15)
BUN: 12 mg/dL (ref 6–20)
CO2: 30 mmol/L (ref 22–32)
Calcium: 7.7 mg/dL — ABNORMAL LOW (ref 8.9–10.3)
Chloride: 97 mmol/L — ABNORMAL LOW (ref 98–111)
Creatinine, Ser: 0.7 mg/dL (ref 0.61–1.24)
GFR, Estimated: >60 mL/min (ref >60)
Glucose, Bld: 93 mg/dL (ref 70–99)
Potassium: 3.4 mmol/L — ABNORMAL LOW (ref 3.5–5.1)
Sodium: 135 mmol/L (ref 135–145)

## 2022-03-26 MED ORDER — FUROSEMIDE 10 MG/ML IJ SOLN
20.0000 mg | Freq: Once | INTRAMUSCULAR | Status: AC
  Administered 2022-03-26: 20 mg via INTRAVENOUS
  Filled 2022-03-26: qty 2

## 2022-03-26 MED ORDER — HALOPERIDOL LACTATE 5 MG/ML IJ SOLN
2.0000 mg | Freq: Four times a day (QID) | INTRAMUSCULAR | Status: DC | PRN
  Administered 2022-03-26 – 2022-04-06 (×3): 2 mg via INTRAVENOUS
  Filled 2022-03-26 (×3): qty 1

## 2022-03-26 MED ORDER — IPRATROPIUM-ALBUTEROL 0.5-2.5 (3) MG/3ML IN SOLN: 3.0000 mL | Freq: Four times a day (QID) | RESPIRATORY_TRACT | Status: DC | PRN

## 2022-03-26 MED ORDER — POTASSIUM CHLORIDE 10 MEQ/100ML IV SOLN
10.0000 meq | INTRAVENOUS | Status: AC
  Administered 2022-03-26 (×4): 10 meq via INTRAVENOUS
  Filled 2022-03-26 (×4): qty 100
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
## 2022-03-26 NOTE — Progress Notes (Signed)
Patient placed in posey belt, bilateral wrist restraints remain in place. At this time pt continuously attempting to get out of bed despite verbal understanding. PRN ativan given throughout day; see MAR and CIWA scores.  ? ?This AM pt attempted without wrist restraints and continuously pulled off oxygen, ECG wires.  ?

## 2022-03-26 NOTE — Plan of Care (Signed)

## 2022-03-26 NOTE — Progress Notes (Addendum)
?   03/26/22 1242 03/26/22 1243 03/26/22 1245  ?Vitals  ?Pulse Rate (!) 110 (!) 109 (!) 110  ?ECG Heart Rate (!) 110 (!) 109 (!) 112  ?Resp (!) 25 (!) 26 (!) 23  ?Oxygen Therapy  ?SpO2 (!) 85 % ?(working with SLP) (!) 88 % (!) 87 %  ?O2 Device HFNC ?(salter) HFNC HFNC ?(salter)  ?O2 Flow Rate (L/min) 8 L/min 12 L/min 12 L/min  ? ? 03/26/22 1248 03/26/22 1253  ?Vitals  ?Pulse Rate (!) 108 (!) 105  ?ECG Heart Rate (!) 108 (!) 106  ?Resp (!) 21 20  ?Oxygen Therapy  ?SpO2 (!) 89 % 97 %  ?O2 Device HFNC ?(salter) Non-rebreather Mask  ?O2 Flow Rate (L/min) 15 L/min 15 L/min  ? ?Patient working with SLP,  aspiration event with coughing. RN immediately to room, O2 increased as pt not rebounding on own.  NRB replaced. Will wean as able.  ? ?1330 MD C. Connor notified via page. ?
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
## 2022-03-27 ENCOUNTER — Inpatient Hospital Stay (HOSPITAL_COMMUNITY): Payer: Medicaid Other

## 2022-03-27 LAB — CBC
HCT: 27.4 % — ABNORMAL LOW (ref 39.0–52.0)
Hemoglobin: 8.6 g/dL — ABNORMAL LOW (ref 13.0–17.0)
MCH: 30.2 pg (ref 26.0–34.0)
MCHC: 31.4 g/dL (ref 30.0–36.0)
MCV: 96.1 fL (ref 80.0–100.0)
Platelets: 169 10*3/uL (ref 150–400)
RBC: 2.85 MIL/uL — ABNORMAL LOW (ref 4.22–5.81)
RDW: 19.6 % — ABNORMAL HIGH (ref 11.5–15.5)
WBC: 13.3 10*3/uL — ABNORMAL HIGH (ref 4.0–10.5)
nRBC: 0 % (ref 0.0–0.2)

## 2022-03-27 LAB — POCT I-STAT 7, (LYTES, BLD GAS, ICA,H+H)
Acid-Base Excess: 8 mmol/L — ABNORMAL HIGH (ref 0.0–2.0)
Acid-Base Excess: 7 mmol/L — ABNORMAL HIGH (ref 0.0–2.0)
Bicarbonate: 34.6 mmol/L — ABNORMAL HIGH (ref 20.0–28.0)
Bicarbonate: 32.8 mmol/L — ABNORMAL HIGH (ref 20.0–28.0)
Calcium, Ion: 1.1 mmol/L — ABNORMAL LOW (ref 1.15–1.40)
Calcium, Ion: 1.05 mmol/L — ABNORMAL LOW (ref 1.15–1.40)
HCT: 29 % — ABNORMAL LOW (ref 39.0–52.0)
HCT: 28 % — ABNORMAL LOW (ref 39.0–52.0)
Hemoglobin: 9.9 g/dL — ABNORMAL LOW (ref 13.0–17.0)
Hemoglobin: 9.5 g/dL — ABNORMAL LOW (ref 13.0–17.0)
O2 Saturation: 89 %
O2 Saturation: 100 %
Patient temperature: 100.2
Patient temperature: 98.7
Potassium: 3.8 mmol/L (ref 3.5–5.1)
Potassium: 4 mmol/L (ref 3.5–5.1)
Sodium: 138 mmol/L (ref 135–145)
Sodium: 137 mmol/L (ref 135–145)
TCO2: 36 mmol/L — ABNORMAL HIGH (ref 22–32)
TCO2: 34 mmol/L — ABNORMAL HIGH (ref 22–32)
pCO2 arterial: 62.2 mmHg — ABNORMAL HIGH (ref 32–48)
pCO2 arterial: 51.6 mmHg — ABNORMAL HIGH (ref 32–48)
pH, Arterial: 7.357 (ref 7.35–7.45)
pH, Arterial: 7.411 (ref 7.35–7.45)
pO2, Arterial: 63 mmHg — ABNORMAL LOW (ref 83–108)
pO2, Arterial: 180 mmHg — ABNORMAL HIGH (ref 83–108)

## 2022-03-27 LAB — BASIC METABOLIC PANEL
Anion gap: 8 (ref 5–15)
BUN: 11 mg/dL (ref 6–20)
CO2: 30 mmol/L (ref 22–32)
Calcium: 7.7 mg/dL — ABNORMAL LOW (ref 8.9–10.3)
Chloride: 98 mmol/L (ref 98–111)
Creatinine, Ser: 0.58 mg/dL — ABNORMAL LOW (ref 0.61–1.24)
GFR, Estimated: >60 mL/min (ref >60)
Glucose, Bld: 101 mg/dL — ABNORMAL HIGH (ref 70–99)
Potassium: 3.6 mmol/L (ref 3.5–5.1)
Sodium: 136 mmol/L (ref 135–145)

## 2022-03-27 LAB — GLUCOSE, CAPILLARY
Glucose-Capillary: 133 mg/dL — ABNORMAL HIGH (ref 70–99)
Glucose-Capillary: 151 mg/dL — ABNORMAL HIGH (ref 70–99)

## 2022-03-27 LAB — PHOSPHORUS
Phosphorus: 2.6 mg/dL (ref 2.5–4.6)
Phosphorus: 3.9 mg/dL (ref 2.5–4.6)

## 2022-03-27 LAB — MAGNESIUM: Magnesium: 2.5 mg/dL — ABNORMAL HIGH (ref 1.7–2.4)

## 2022-03-27 MED ORDER — PROPOFOL 1000 MG/100ML IV EMUL
0.0000 ug/kg/min | INTRAVENOUS | Status: DC
  Administered 2022-03-27: 10 ug/kg/min via INTRAVENOUS
  Filled 2022-03-27: qty 100

## 2022-03-27 MED ORDER — NOREPINEPHRINE 4 MG/250ML-% IV SOLN
2.0000 ug/min | INTRAVENOUS | Status: DC
  Administered 2022-03-28: 2 ug/min via INTRAVENOUS
  Filled 2022-03-27: qty 250

## 2022-03-27 MED ORDER — CHLORHEXIDINE GLUCONATE 0.12% ORAL RINSE (MEDLINE KIT)
15.0000 mL | Freq: Two times a day (BID) | OROMUCOSAL | Status: DC
  Administered 2022-03-27 – 2022-03-31 (×8): 15 mL via OROMUCOSAL

## 2022-03-27 MED ORDER — ATROPINE SULFATE 1 MG/10ML IJ SOSY
1.0000 mg | PREFILLED_SYRINGE | Freq: Once | INTRAMUSCULAR | Status: AC
  Administered 2022-03-27: 1 mg via INTRAVENOUS

## 2022-03-27 MED ORDER — SODIUM CHLORIDE 0.9 % IV SOLN
250.0000 mL | INTRAVENOUS | Status: DC
  Administered 2022-03-30: 250 mL via INTRAVENOUS

## 2022-03-27 MED ORDER — FUROSEMIDE 10 MG/ML IJ SOLN
80.0000 mg | Freq: Once | INTRAMUSCULAR | Status: AC
Start: 2022-03-27 — End: 2022-03-27
  Administered 2022-03-27: 80 mg via INTRAVENOUS
  Filled 2022-03-27: qty 8

## 2022-03-27 MED ORDER — FENTANYL BOLUS VIA INFUSION
50.0000 ug | INTRAVENOUS | Status: DC | PRN
  Administered 2022-03-28 – 2022-03-30 (×4): 50 ug via INTRAVENOUS

## 2022-03-27 MED ORDER — POLYETHYLENE GLYCOL 3350 17 G PO PACK
17.0000 g | PACK | Freq: Every day | ORAL | Status: DC
  Administered 2022-03-30 – 2022-04-09 (×5): 17 g
  Filled 2022-03-27 (×8): qty 1

## 2022-03-27 MED ORDER — OSMOLITE 1.5 CAL PO LIQD
1000.0000 mL | ORAL | Status: DC
  Administered 2022-03-27 – 2022-04-04 (×10): 1000 mL

## 2022-03-27 MED ORDER — FENTANYL CITRATE PF 50 MCG/ML IJ SOSY
50.0000 ug | PREFILLED_SYRINGE | Freq: Once | INTRAMUSCULAR | Status: AC
  Administered 2022-03-27: 50 ug via INTRAVENOUS

## 2022-03-27 MED ORDER — ETOMIDATE 2 MG/ML IV SOLN
INTRAVENOUS | Status: AC
  Filled 2022-03-27: qty 20

## 2022-03-27 MED ORDER — SUCCINYLCHOLINE CHLORIDE 200 MG/10ML IV SOSY
PREFILLED_SYRINGE | INTRAVENOUS | Status: AC
  Filled 2022-03-27: qty 10

## 2022-03-27 MED ORDER — DOCUSATE SODIUM 50 MG/5ML PO LIQD
100.0000 mg | Freq: Two times a day (BID) | ORAL | Status: DC
  Administered 2022-03-27 – 2022-04-09 (×13): 100 mg
  Filled 2022-03-27 (×19): qty 10

## 2022-03-27 MED ORDER — ORAL CARE MOUTH RINSE
15.0000 mL | OROMUCOSAL | Status: DC
  Administered 2022-03-27 – 2022-03-31 (×36): 15 mL via OROMUCOSAL

## 2022-03-27 MED ORDER — EPINEPHRINE 1 MG/10ML IJ SOSY: 0.1000 mg | PREFILLED_SYRINGE | Freq: Once | INTRAMUSCULAR | Status: AC

## 2022-03-27 MED ORDER — SUCCINYLCHOLINE CHLORIDE 200 MG/10ML IV SOSY
200.0000 mg | PREFILLED_SYRINGE | Freq: Once | INTRAVENOUS | Status: AC
  Administered 2022-03-27: 200 mg via INTRAVENOUS

## 2022-03-27 MED ORDER — PROSOURCE TF PO LIQD
45.0000 mL | Freq: Three times a day (TID) | ORAL | Status: DC
  Administered 2022-03-27 – 2022-04-06 (×30): 45 mL
  Filled 2022-03-27 (×30): qty 45

## 2022-03-27 MED ORDER — FENTANYL 2500MCG IN NS 250ML (10MCG/ML) PREMIX INFUSION
50.0000 ug/h | INTRAVENOUS | Status: DC
  Administered 2022-03-27: 50 ug/h via INTRAVENOUS
  Administered 2022-03-28: 150 ug/h via INTRAVENOUS
  Administered 2022-03-29: 125 ug/h via INTRAVENOUS
  Administered 2022-03-30: 50 ug/h via INTRAVENOUS
  Filled 2022-03-27 (×4): qty 250

## 2022-03-27 NOTE — Progress Notes (Signed)
SLP Cancellation Note ? ?Patient Details ?Name: Phillip Juarez ?MRN: [id number] ?DOB: [birth year] ? ? ?Cancelled treatment:       Reason Eval/Treat Not Completed: Medical issues which prohibited therapy. Pt with respiratory compromise.  Spoke with RN and pt may require intubation.  SLP will hold PO trials at this time.  SLP to follow for reassessment when pt is medically appropriate. ? ? ?Cailan Antonucci E Kenna Seward, MA, CCC-SLP ?Acute Rehabilitation Services ?Office: [phone] ?03/27/2022, 9:45 AM ?

## 2022-03-27 NOTE — Progress Notes (Signed)
Pt with labile respiratory status throughout shift. At 1440, pt was yelling and upon entering room to check on him, SaO2 was in the 40s. Pt quickly became bradycardic, BVM initiated by self and additional staff called to room. Pt quickly became pulseless and CPR was initiated. See code sheet for full details of resuscitation effort. Family notified.  ?

## 2022-03-27 NOTE — Progress Notes (Signed)
?   03/27/22 1444  ?Clinical Encounter Type  ?Visited With Health care provider;Patient not available ?(Sister has been called by medical staff)  ?Visit Type Code;Initial ?(CODE BLUE)  ?Referral From Nurse  ?Consult/Referral To Chaplain ?Albertina Parr [place])  ? ?Paged for CODE BLUE. Upon arrival medical team attending to patient. No Family present, but medical staff called sister and she is in route.  ?Chaplain services available for further consultation at request of patient, family or staff, ?[address] [place] [phone]. ?
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
## 2022-03-27 NOTE — Progress Notes (Signed)
Patient went into cardiac arrest, presumably respiratory in etiology. CVC placed in R fem, intubated with glide, grade 1 view, extensive dried secretions obstructing the airway cleared prior to ETT placement. ? ?Clinical update provided in person to the patient's HC-POA and two other family members after their arrival. Discussion held regarding clinical state and expectations regarding recovery. Encouraged family to center patient in decision-making, especially when considering the patient's ultimate disposition.  ? ?Critical care time: 38min  ? ?Jesusita Oka, MD ?General and Trauma Surgery ?[facility]' ?

## 2022-03-27 NOTE — Progress Notes (Signed)
PT Cancellation Note ? ?Patient Details ?Name: Phillip Juarez ?MRN: [id number] ?DOB: Mar 14, 1966 ? ? ?Cancelled Treatment:    Reason Eval/Treat Not Completed: Medical issues which prohibited therapy. Pt on non-rebreather, not medically appropriate for PT at this time. PT to follow up as time allows. ? ? ?Arlyss Gandy ?03/27/2022, 12:00 PM ?

## 2022-03-27 NOTE — Plan of Care (Signed)
Patient is intubated, sedated. S/P CPR event earlier today. ? ? ?Problem: Education: ?Goal: Knowledge of General Education information will improve ?Description: Including pain rating scale, medication(s)/side effects and non-pharmacologic comfort measures ?Outcome: Not Progressing ?  ?Problem: Health Behavior/Discharge Planning: ?Goal: Ability to manage health-related needs will improve ?Outcome: Not Progressing ?  ?Problem: Clinical Measurements: ?Goal: Ability to maintain clinical measurements within normal limits will improve ?Outcome: Not Progressing ?Goal: Will remain free from infection ?Outcome: Not Progressing ?Goal: Diagnostic test results will improve ?Outcome: Not Progressing ?Goal: Respiratory complications will improve ?Outcome: Not Progressing ?Goal: Cardiovascular complication will be avoided ?Outcome: Not Progressing ?  ?Problem: Activity: ?Goal: Risk for activity intolerance will decrease ?Outcome: Not Progressing ?  ?Problem: Nutrition: ?Goal: Adequate nutrition will be maintained ?Outcome: Not Progressing ?  ?Problem: Coping: ?Goal: Level of anxiety will decrease ?Outcome: Not Progressing ?  ?Problem: Elimination: ?Goal: Will not experience complications related to bowel motility ?Outcome: Not Progressing ?Goal: Will not experience complications related to urinary retention ?Outcome: Not Progressing ?  ?Problem: Pain Managment: ?Goal: General experience of comfort will improve ?Outcome: Not Progressing ?  ?Problem: Safety: ?Goal: Ability to remain free from injury will improve ?Outcome: Not Progressing ?  ?Problem: Skin Integrity: ?Goal: Risk for impaired skin integrity will decrease ?Outcome: Not Progressing ?  ?Problem: Safety: ?Goal: Non-violent Restraint(s) ?Outcome: Not Progressing ?  ?

## 2022-03-27 NOTE — Procedures (Signed)
Intubation Procedure Note ? ?Phillip Juarez  ?[id number]  ?[birth year] ? ?Date:03/27/22  ?Time:6:16 PM  ? ?Provider Performing: Jesusita Oka  ? ? ?Procedure: Intubation ([id number]) ? ?Indication(s) ?Respiratory Failure ? ?Consent ?Unable to obtain consent due to emergent nature of procedure. ? ? ?Anesthesia ?None ? ? ?Time Out ?Verified patient identification, verified procedure, site/side was marked, verified correct patient position, special equipment/implants available, medications/allergies/relevant history reviewed, required imaging and test results available. ? ? ?Sterile Technique ?Usual hand hygeine, masks, and gloves were used ? ? ?Procedure Description ?Patient positioned in bed supine.  Sedation given as noted above.  Patient was intubated with endotracheal tube using Glidescope.  View was Grade 1 full glottis .  Number of attempts was 1.  Colorimetric CO2 detector was consistent with tracheal placement. ? ? ?Complications/Tolerance ?None; patient tolerated the procedure well. ?Chest X-ray is ordered to verify placement. ? ? ?EBL ?Minimal ? ? ?Specimen(s) ?None ? ?

## 2022-03-27 NOTE — Procedures (Signed)
Arterial Catheter Insertion Procedure Note ? ?Phillip Juarez  ?[id number]  ?23-Aug-1966 ? ?Date:03/27/22  ?Time:11:47 PM  ? ? ?Provider Performing: Alvera Singh  ? ? ?Procedure: Insertion of Arterial Line [phone]) without US guidance ? ?Indication(s) ?Blood pressure monitoring and/or need for frequent ABGs ? ?Consent ?Risks of the procedure as well as the alternatives and risks of each were explained to the patient and/or caregiver.  Consent for the procedure was obtained and is signed in the bedside chart ? ?Anesthesia ?None ? ? ?Time Out ?Verified patient identification, verified procedure, site/side was marked, verified correct patient position, special equipment/implants available, medications/allergies/relevant history reviewed, required imaging and test results available. ? ? ?Sterile Technique ?Maximal sterile technique including full sterile barrier drape, hand hygiene, sterile gown, sterile gloves, mask, hair covering, sterile ultrasound probe cover (if used). ? ? ?Procedure Description ?Area of catheter insertion was cleaned with chlorhexidine and draped in sterile fashion. With real-time ultrasound guidance an arterial catheter was placed into the left radial artery.  Appropriate arterial tracings confirmed on monitor.   ? ? ?Complications/Tolerance ?None; patient tolerated the procedure well. ? ? ?EBL ?Minimal ? ? ?Specimen(s) ?None ? ?
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
## 2022-03-27 NOTE — Procedures (Signed)
? ?  Procedure Note ? ?Date: 03/27/2022 ? ?Procedure: central venous catheter placement--right, femoral vein, without ultrasound guidance ? ?Pre-op diagnosis:  ACLS ?Post-op diagnosis: same ? ?Surgeon: Jesusita Oka, MD ? ?EBL: <5cc ?Drains/Implants: triple lumen central venous catheter ? ?Description of procedure: This procedure was performed emergently, therefore informed consent was not obtained, and was performed under non-sterile conditions. The right femoral  vein was localized using anatomic landmarks, accessed using an introducer needle, and a guidewire passed through the needle. The needle was removed and a skin nick was made. The tract was dilated and the central venous catheter advanced over the guidewire followed by removal of the guidewire. All ports drew blood easily and all were flushed with saline. The catheter was secured to the skin with suture. ? ? ?Jesusita Oka, MD ?General and Trauma Surgery ?[facility] ? ?

## 2022-03-28 ENCOUNTER — Inpatient Hospital Stay: Payer: Self-pay

## 2022-03-28 DIAGNOSIS — Z7189 Other specified counseling: Secondary | ICD-10-CM

## 2022-03-28 DIAGNOSIS — J9601 Acute respiratory failure with hypoxia: Secondary | ICD-10-CM

## 2022-03-28 DIAGNOSIS — I469 Cardiac arrest, cause unspecified: Secondary | ICD-10-CM

## 2022-03-28 DIAGNOSIS — E440 Moderate protein-calorie malnutrition: Secondary | ICD-10-CM | POA: Diagnosis not present

## 2022-03-28 DIAGNOSIS — S2242XA Multiple fractures of ribs, left side, initial encounter for closed fracture: Secondary | ICD-10-CM | POA: Diagnosis not present

## 2022-03-28 DIAGNOSIS — F1020 Alcohol dependence, uncomplicated: Secondary | ICD-10-CM | POA: Diagnosis not present

## 2022-03-28 LAB — CBC WITH DIFFERENTIAL/PLATELET
Abs Immature Granulocytes: 0.09 10*3/uL — ABNORMAL HIGH (ref 0.00–0.07)
Basophils Absolute: 0.1 10*3/uL (ref 0.0–0.1)
Basophils Relative: 1 %
Eosinophils Absolute: 0.3 10*3/uL (ref 0.0–0.5)
Eosinophils Relative: 2 %
HCT: 26.6 % — ABNORMAL LOW (ref 39.0–52.0)
Hemoglobin: 8.1 g/dL — ABNORMAL LOW (ref 13.0–17.0)
Immature Granulocytes: 1 %
Lymphocytes Relative: 52 %
Lymphs Abs: 9.8 10*3/uL — ABNORMAL HIGH (ref 0.7–4.0)
MCH: 30.5 pg (ref 26.0–34.0)
MCHC: 30.5 g/dL (ref 30.0–36.0)
MCV: 100 fL (ref 80.0–100.0)
Monocytes Absolute: 1.6 10*3/uL — ABNORMAL HIGH (ref 0.1–1.0)
Monocytes Relative: 9 %
Neutro Abs: 6.4 10*3/uL (ref 1.7–7.7)
Neutrophils Relative %: 35 %
Platelets: 212 10*3/uL (ref 150–400)
RBC: 2.66 MIL/uL — ABNORMAL LOW (ref 4.22–5.81)
RDW: 19.9 % — ABNORMAL HIGH (ref 11.5–15.5)
WBC: 18.3 10*3/uL — ABNORMAL HIGH (ref 4.0–10.5)
nRBC: 0 % (ref 0.0–0.2)

## 2022-03-28 LAB — BASIC METABOLIC PANEL
Anion gap: 7 (ref 5–15)
BUN: 22 mg/dL — ABNORMAL HIGH (ref 6–20)
CO2: 34 mmol/L — ABNORMAL HIGH (ref 22–32)
Calcium: 7.7 mg/dL — ABNORMAL LOW (ref 8.9–10.3)
Chloride: 99 mmol/L (ref 98–111)
Creatinine, Ser: 0.74 mg/dL (ref 0.61–1.24)
GFR, Estimated: >60 mL/min (ref >60)
Glucose, Bld: 136 mg/dL — ABNORMAL HIGH (ref 70–99)
Potassium: 3.9 mmol/L (ref 3.5–5.1)
Sodium: 140 mmol/L (ref 135–145)

## 2022-03-28 LAB — POCT I-STAT 7, (LYTES, BLD GAS, ICA,H+H)
Acid-Base Excess: 10 mmol/L — ABNORMAL HIGH (ref 0.0–2.0)
Bicarbonate: 37.5 mmol/L — ABNORMAL HIGH (ref 20.0–28.0)
Calcium, Ion: 1.12 mmol/L — ABNORMAL LOW (ref 1.15–1.40)
HCT: 26 % — ABNORMAL LOW (ref 39.0–52.0)
Hemoglobin: 8.8 g/dL — ABNORMAL LOW (ref 13.0–17.0)
O2 Saturation: 98 %
Patient temperature: 99.2
Potassium: 3.7 mmol/L (ref 3.5–5.1)
Sodium: 140 mmol/L (ref 135–145)
TCO2: 40 mmol/L — ABNORMAL HIGH (ref 22–32)
pCO2 arterial: 67.5 mmHg (ref 32–48)
pH, Arterial: 7.354 (ref 7.35–7.45)
pO2, Arterial: 107 mmHg (ref 83–108)

## 2022-03-28 LAB — GLUCOSE, CAPILLARY
Glucose-Capillary: 138 mg/dL — ABNORMAL HIGH (ref 70–99)
Glucose-Capillary: 138 mg/dL — ABNORMAL HIGH (ref 70–99)
Glucose-Capillary: 150 mg/dL — ABNORMAL HIGH (ref 70–99)
Glucose-Capillary: 155 mg/dL — ABNORMAL HIGH (ref 70–99)
Glucose-Capillary: 157 mg/dL — ABNORMAL HIGH (ref 70–99)
Glucose-Capillary: 173 mg/dL — ABNORMAL HIGH (ref 70–99)

## 2022-03-28 LAB — MAGNESIUM
Magnesium: 2.6 mg/dL — ABNORMAL HIGH (ref 1.7–2.4)
Magnesium: 2.7 mg/dL — ABNORMAL HIGH (ref 1.7–2.4)

## 2022-03-28 LAB — TRIGLYCERIDES: Triglycerides: 137 mg/dL (ref <150)

## 2022-03-28 LAB — PHOSPHORUS
Phosphorus: 3 mg/dL (ref 2.5–4.6)
Phosphorus: 1.8 mg/dL — ABNORMAL LOW (ref 2.5–4.6)

## 2022-03-28 MED ORDER — SODIUM CHLORIDE 0.9% FLUSH: 10.0000 mL | INTRAVENOUS | Status: DC | PRN

## 2022-03-28 MED ORDER — NOREPINEPHRINE 16 MG/250ML-% IV SOLN
0.0000 ug/min | INTRAVENOUS | Status: DC
  Administered 2022-03-28: 2 ug/min via INTRAVENOUS
  Filled 2022-03-28: qty 250

## 2022-03-28 MED ORDER — QUETIAPINE FUMARATE 50 MG PO TABS
25.0000 mg | ORAL_TABLET | Freq: Two times a day (BID) | ORAL | Status: DC
  Administered 2022-03-28 – 2022-04-09 (×25): 25 mg
  Filled 2022-03-28 (×26): qty 1

## 2022-03-28 MED ORDER — POTASSIUM CHLORIDE 20 MEQ PO PACK
40.0000 meq | PACK | Freq: Once | ORAL | Status: AC
  Administered 2022-03-28: 40 meq
  Filled 2022-03-28 (×2): qty 2

## 2022-03-28 MED ORDER — SODIUM CHLORIDE 0.9% FLUSH
10.0000 mL | Freq: Two times a day (BID) | INTRAVENOUS | Status: DC
  Administered 2022-03-28: 10 mL
  Administered 2022-03-29: 30 mL
  Administered 2022-03-29 – 2022-03-30 (×3): 10 mL
  Administered 2022-03-31: 20 mL
  Administered 2022-03-31: 10 mL
  Administered 2022-04-01: 20 mL
  Administered 2022-04-01 – 2022-04-02 (×2): 10 mL
  Administered 2022-04-02 – 2022-04-03 (×2): 20 mL
  Administered 2022-04-04 – 2022-04-06 (×5): 10 mL
  Administered 2022-04-07: 30 mL
  Administered 2022-04-07: 10 mL
  Administered 2022-04-08: 30 mL
  Administered 2022-04-08 – 2022-04-18 (×18): 10 mL

## 2022-03-28 MED ORDER — BETHANECHOL CHLORIDE 25 MG PO TABS
25.0000 mg | ORAL_TABLET | Freq: Three times a day (TID) | ORAL | Status: DC
  Administered 2022-03-28 – 2022-04-09 (×39): 25 mg
  Filled 2022-03-28 (×39): qty 1

## 2022-03-28 MED ORDER — GUAIFENESIN 100 MG/5ML PO LIQD
15.0000 mL | Freq: Four times a day (QID) | ORAL | Status: DC
  Administered 2022-03-28 – 2022-04-10 (×51): 15 mL
  Filled 2022-03-28 (×2): qty 15
  Filled 2022-03-28 (×4): qty 20
  Filled 2022-03-28 (×3): qty 15
  Filled 2022-03-28: qty 20
  Filled 2022-03-28 (×2): qty 15
  Filled 2022-03-28: qty 20
  Filled 2022-03-28 (×9): qty 15
  Filled 2022-03-28: qty 20
  Filled 2022-03-28: qty 15
  Filled 2022-03-28 (×3): qty 20
  Filled 2022-03-28 (×3): qty 15
  Filled 2022-03-28: qty 20
  Filled 2022-03-28 (×3): qty 15
  Filled 2022-03-28: qty 20
  Filled 2022-03-28 (×2): qty 15
  Filled 2022-03-28: qty 20
  Filled 2022-03-28: qty 15
  Filled 2022-03-28: qty 20
  Filled 2022-03-28 (×5): qty 15
  Filled 2022-03-28: qty 20
  Filled 2022-03-28 (×6): qty 15

## 2022-03-28 NOTE — Progress Notes (Signed)
PICC ordered; IV team not on campus and likely unable to place PICC today.  ?

## 2022-03-28 NOTE — Progress Notes (Signed)
Peripherally Inserted Central Catheter Placement ? ?The IV Nurse has discussed with the patient and/or persons authorized to consent for the patient, the purpose of this procedure and the potential benefits and risks involved with this procedure.  The benefits include less needle sticks, lab draws from the catheter, and the patient may be discharged home with the catheter. Risks include, but not limited to, infection, bleeding, blood clot (thrombus formation), and puncture of an artery; nerve damage and irregular heartbeat and possibility to perform a PICC exchange if needed/ordered by physician.  Alternatives to this procedure were also discussed.  Bard Power PICC patient education guide, fact sheet on infection prevention and patient information card has been provided to patient /or left at bedside.  PICC inserted by Elenore Paddy. RN ? ? ?PICC Placement Documentation  ?PICC Triple Lumen 03/28/22 Right Basilic 45 cm 1 cm (Active)  ?Indication for Insertion or Continuance of Line Prolonged intravenous therapies;Limited venous access - need for IV therapy >5 days (PICC only);Vasoactive infusions 03/28/22 1933  ?Exposed Catheter (cm) 1 cm 03/28/22 1933  ?Site Assessment Clean;Dry;Intact 03/28/22 1933  ?Lumen #1 Status Flushed;Saline locked;Blood return noted 03/28/22 1933  ?Lumen #2 Status Flushed;Saline locked;Blood return noted 03/28/22 1933  ?Lumen #3 Status Flushed;Saline locked;Blood return noted 03/28/22 1933  ?Dressing Type Transparent;Securing device 03/28/22 1933  ?Dressing Status Antimicrobial disc in place;Clean, Dry, Intact 03/28/22 1933  ?Safety Lock Not Applicable 03/28/22 1933  ?Line Care Connections checked and tightened 03/28/22 1933  ?Line Adjustment (NICU/IV Team Only) No 03/28/22 1933  ?Dressing Intervention New dressing 03/28/22 1933  ?Dressing Change Due 04/04/22 03/28/22 1933  ? ? ? ? ? ?Latwan Luchsinger, Lajean Manes ?03/28/2022, 7:34 PM ? ?

## 2022-03-28 NOTE — Progress Notes (Signed)
SLP Cancellation Note ? ?Patient Details ?Name: Phillip Juarez ?MRN: [id number] ?DOB: 05/23/1966 ? ? ?Cancelled treatment:       Reason Eval/Treat Not Completed: Patient not medically ready. Pt intubated. Will follow for readiness ? ? ?Eryn Krejci, Katherene Ponto ?03/28/2022, 8:05 AM ?
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
## 2022-03-28 NOTE — Progress Notes (Signed)
PT Cancellation Note ? ?Patient Details ?Name: Phillip Juarez ?MRN: [id number] ?DOB: 1966/10/25 ? ? ?Cancelled Treatment:    Reason Eval/Treat Not Completed: Medical issues which prohibited therapy (Pt intubated yesterday. HOLD today per nurse.) ? ? ?[name] F [name] ?03/28/2022, 9:51 AM ?[name] M,PT ?Acute Rehab Services ?[phone] ?[phone] (pager)  ?

## 2022-03-28 NOTE — Progress Notes (Signed)
Consent obtained from [facility] for PICC ?

## 2022-03-28 NOTE — Progress Notes (Signed)
OT Cancellation Note ? ?Patient Details ?Name: Phillip Juarez ?MRN: [id number] ?DOB: October 29, 1966 ? ? ?Cancelled Treatment:    Reason Eval/Treat Not Completed: Medical issues which prohibited therapy (Pt intubated yesterday after cardiac arrest. HOLD today per nurse) ? ?Neda Willenbring A Carleena Mires ?03/28/2022, 10:18 AM ?
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
## 2022-03-28 NOTE — Progress Notes (Signed)
0347 Paged palliative care per family request ? ?0900 Levo started, see MAR for titration, changed to central line/concentrated order.  ? ?0925 spastic non rhythmic movements noted in all extremities; patient able to follow commands and nod appropriately. Movements intermittent, lasting for several seconds each time.  ? ?0930 MD B. Janee Morn made aware of levo intiation and spastic movements.  ?

## 2022-03-29 LAB — POCT I-STAT 7, (LYTES, BLD GAS, ICA,H+H)
Acid-Base Excess: 8 mmol/L — ABNORMAL HIGH (ref 0.0–2.0)
Bicarbonate: 32.6 mmol/L — ABNORMAL HIGH (ref 20.0–28.0)
Calcium, Ion: 1.09 mmol/L — ABNORMAL LOW (ref 1.15–1.40)
HCT: 23 % — ABNORMAL LOW (ref 39.0–52.0)
Hemoglobin: 7.8 g/dL — ABNORMAL LOW (ref 13.0–17.0)
O2 Saturation: 96 %
Patient temperature: 100.3
Potassium: 3.8 mmol/L (ref 3.5–5.1)
Sodium: 144 mmol/L (ref 135–145)
TCO2: 34 mmol/L — ABNORMAL HIGH (ref 22–32)
pCO2 arterial: 50.6 mmHg — ABNORMAL HIGH (ref 32–48)
pH, Arterial: 7.422 (ref 7.35–7.45)
pO2, Arterial: 88 mmHg (ref 83–108)

## 2022-03-29 LAB — CBC
HCT: 26 % — ABNORMAL LOW (ref 39.0–52.0)
Hemoglobin: 8 g/dL — ABNORMAL LOW (ref 13.0–17.0)
MCH: 30.9 pg (ref 26.0–34.0)
MCHC: 30.8 g/dL (ref 30.0–36.0)
MCV: 100.4 fL — ABNORMAL HIGH (ref 80.0–100.0)
Platelets: 198 10*3/uL (ref 150–400)
RBC: 2.59 MIL/uL — ABNORMAL LOW (ref 4.22–5.81)
RDW: 20.8 % — ABNORMAL HIGH (ref 11.5–15.5)
WBC: 19.2 10*3/uL — ABNORMAL HIGH (ref 4.0–10.5)
nRBC: 0.1 % (ref 0.0–0.2)

## 2022-03-29 LAB — GLUCOSE, CAPILLARY
Glucose-Capillary: 152 mg/dL — ABNORMAL HIGH (ref 70–99)
Glucose-Capillary: 152 mg/dL — ABNORMAL HIGH (ref 70–99)
Glucose-Capillary: 134 mg/dL — ABNORMAL HIGH (ref 70–99)
Glucose-Capillary: 127 mg/dL — ABNORMAL HIGH (ref 70–99)
Glucose-Capillary: 133 mg/dL — ABNORMAL HIGH (ref 70–99)

## 2022-03-29 LAB — BASIC METABOLIC PANEL
Anion gap: 4 — ABNORMAL LOW (ref 5–15)
BUN: 23 mg/dL — ABNORMAL HIGH (ref 6–20)
CO2: 33 mmol/L — ABNORMAL HIGH (ref 22–32)
Calcium: 7.8 mg/dL — ABNORMAL LOW (ref 8.9–10.3)
Chloride: 103 mmol/L (ref 98–111)
Creatinine, Ser: 0.74 mg/dL (ref 0.61–1.24)
GFR, Estimated: >60 mL/min (ref >60)
Glucose, Bld: 160 mg/dL — ABNORMAL HIGH (ref 70–99)
Potassium: 4 mmol/L (ref 3.5–5.1)
Sodium: 140 mmol/L (ref 135–145)

## 2022-03-29 LAB — MAGNESIUM
Magnesium: 3 mg/dL — ABNORMAL HIGH (ref 1.7–2.4)
Magnesium: 3.1 mg/dL — ABNORMAL HIGH (ref 1.7–2.4)

## 2022-03-29 LAB — PATHOLOGIST SMEAR REVIEW
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
## 2022-03-29 NOTE — Progress Notes (Signed)
SLP Cancellation Note ? ?Patient Details ?Name: Phillip Juarez ?MRN: [id number] ?DOB: 1966/11/13 ? ? ?Cancelled treatment:       Reason Eval/Treat Not Completed: Patient not medically ready (Pt remains on the vent at this time. SLP will follow up.) ? ?Kiernan Farkas I. Vear Clock, MS, CCC-SLP ?Acute Rehabilitation Services ?Office number [phone] ?Pager [phone] ? ?Scheryl Marten ?03/29/2022, 8:21 AM ?

## 2022-03-29 NOTE — Progress Notes (Signed)
OT Cancellation Note ? ?Patient Details ?Name: Phillip Juarez ?MRN: [id number] ?DOB: Jun 08, 1966 ? ? ?Cancelled Treatment:    Reason Eval/Treat Not Completed: Patient at procedure or test/ unavailable (Pt recieving chest physiotherapy upon arrival, OT treatment to return as appropriate.) ? ?Kieffer Blatz A Ernesto Lashway ?03/29/2022, 3:14 PM ?

## 2022-03-29 NOTE — Progress Notes (Signed)
Patient [id number] Phillip Juarez      DOB: Jul 03, 1966      KGM:[id number] ? ? ? ?  ?Palliative Medicine Team ? ? ? ?Subjective: Bedside symptom check completed. No family or visitors present at time of visit. ? ? ?Physical exam: Patient resting in bed with eyes closed at time of visit. Breathing even and non-labored, supported by ventilator, no excessive secretions noted. Patient without physical or non-verbal signs of pain or discomfort at this time with low dose infusion of fentanyl. Patient able to make purposeful movement in all extremities and follows commands per bedside RN. This RN did not attempt to stimulate the patient for assessment to encourage rest and healing. Patient tolerating nutrition through NG tube, rectal tube in place with output noted.   ? ? ?Assessment and plan: This RN discussed the day with bedside RN Brittney. She reports that the patient's blood pressure has improved to no longer require levophed at this time. She reports his arousal with following commands and tolerating ventilator weaning. She assisted the patient's family in answering questions regarding DNR status, but family unsure about change at this time. Will continue to follow for any changes or advances.  ? ? ?Thank you for allowing the Palliative Medicine Team to assist in the care of this patient. ?  ?  ?Shelda Jakes, MSN, RN ?Palliative Medicine Team ?Team Phone: [phone]  ?This phone is monitored 7a-7p, please reach out to attending physician outside of these hours for urgent needs.   ?

## 2022-03-29 NOTE — Progress Notes (Signed)
Patient ID: Phillip Juarez, male   DOB: 11/22/1965, 56 y.o.   MRN: [id number] ?Follow up - Trauma Critical Care ?  ?Patient Details:  ?  ?Phillip Juarez is an 56 y.o. male. ? ?Lines/tubes ?: ?Airway 8 mm (Active)  ?Secured at (cm) 25 cm 03/29/22 0804  ?Measured From Lips 03/29/22 0804  ?Secured Location Left 03/29/22 0804  ?Secured By [employer] Tube Holder 03/29/22 0804  ?Tube Holder Repositioned Yes 03/29/22 0804  ?Prone position No 03/28/22 2352  ?Cuff Pressure (cm H2O) Green [facility] 18[facility] 03/29/22 0804  ?Site Condition Dry 03/29/22 0804  ?   ?PICC Triple Lumen 03/28/22 Right Basilic 45 cm 1 cm (Active)  ?Indication for Insertion or Continuance of Line Prolonged intravenous therapies;Limited venous access - need for IV therapy >5 days (PICC only);Vasoactive infusions 03/28/22 1933  ?Exposed Catheter (cm) 1 cm 03/28/22 1933  ?Site Assessment Clean;Dry;Intact 03/28/22 1933  ?Lumen #1 Status Flushed;Saline locked;Blood return noted 03/28/22 1933  ?Lumen #2 Status Flushed;Saline locked;Blood return noted 03/28/22 1933  ?Lumen #3 Status Flushed;Saline locked;Blood return noted 03/28/22 1933  ?Dressing Type Transparent;Securing device 03/28/22 1933  ?Dressing Status Antimicrobial disc in place;Clean, Dry, Intact 03/28/22 1933  ?Safety Lock Not Applicable 03/28/22 1933  ?Line Care Connections checked and tightened 03/28/22 1933  ?Line Adjustment (NICU/IV Team Only) No 03/28/22 1933  ?Dressing Intervention New dressing 03/28/22 1933  ?Dressing Change Due 04/04/22 03/28/22 1933  ?   ?Arterial Line 03/27/22 Left Radial (Active)  ?Site Assessment Clean, Dry, Intact 03/28/22 2000  ?Line Status Pulsatile blood flow 03/28/22 2000  ?Art Line Waveform Appropriate 03/28/22 2000  ?Art Line Interventions Zeroed and calibrated;Leveled;Tubing changed;Connections checked and tightened;Flushed per protocol 03/28/22 2000  ?Color/Movement/Sensation Capillary refill less than 3 sec 03/28/22 2000  ?Dressing Type Transparent 03/28/22  2000  ?Dressing Status Clean, Dry, Intact 03/28/22 2000  ?Interventions New dressing 03/28/22 0000  ?Dressing Change Due 04/04/22 03/28/22 2000  ?   ?Flatus Tube/Pouch (Active)  ?Daily care Skin around tube assessed;Skin barrier applied to rectal area;Assess location of position indicator line;Flushed tube with 30mL water (document as intake) 03/28/22 2000  ?Output (mL) 150 mL 03/29/22 0600  ?Intake (mL) 0 mL 03/28/22 0400  ?   ?External Urinary Catheter (Active)  ?Geophysicist/field seismologistCollection Container Dedicated Suction Canister 03/28/22 2000  ?Suction (Verified suction is between 40-80 mmHg) Yes 03/28/22 2000  ?Securement Method Tape 03/28/22 0400  ?Site Assessment Clean, Dry, Intact 03/28/22 2000  ?Intervention Male External Urinary Catheter Replaced 03/28/22 1430  ?Output (mL) 0 mL 03/29/22 0600  ? ? ?Microbiology/Sepsis markers: ?Results for orders placed or performed during the hospital encounter of 03/18/22  ?Urine Culture     Status: Abnormal  ? Collection Time: 03/19/22  9:49 AM  ? Specimen: Urine, Clean Catch  ?Result Value Ref Range Status  ? Specimen Description URINE, CLEAN CATCH  Final  ? Special Requests NONE  Final  ? Culture (A)  Final  ?  <10,000 COLONIES/mL INSIGNIFICANT GROWTH ?Performed at [facility], [address] [place], [place] [address] ?  ? Report Status 03/20/2022 FINAL  Final  ?MRSA Next Gen by PCR, Nasal     Status: None  ? Collection Time: 03/19/22 10:39 AM  ? Specimen: Nasal Mucosa; Nasal Swab  ?Result Value Ref Range Status  ? MRSA by PCR Next Gen NOT DETECTED NOT DETECTED Final  ?  Comment: (NOTE) ?The GeneXpert MRSA Assay (FDA approved for NASAL specimens only), ?is one component of a comprehensive MRSA colonization surveillance ?program.  It is not intended to diagnose MRSA infection nor to guide ?or monitor treatment for MRSA infections. ?Test performance is not FDA approved in patients less than 2 years ?old. ?Performed at [facility], [address] [place], [place] ?[address] ?   ?Culture, Respiratory w Gram Stain     Status: None (Preliminary result)  ? Collection Time: 03/28/22 11:16 AM  ? Specimen: Tracheal Aspirate; Respiratory  ?Result Value Ref Range Status  ? Specimen Description TRACHEAL ASPIRATE  Final  ? Special Requests NONE  Final  ? Gram Stain   Final  ?  FEW SQUAMOUS EPITHELIAL CELLS PRESENT ?ABUNDANT WBC PRESENT,BOTH PMN AND MONONUCLEAR ?NO ORGANISMS SEEN ?Performed at [facility], [address], [place], [place] [address] ?  ? Culture PENDING  Incomplete  ? Report Status PENDING  Incomplete  ? ? ?Anti-infectives:  ?Anti-infectives (From admission, onward)  ? ? Start     Dose/Rate Route Frequency Ordered Stop  ? 03/25/22 0930  ceFEPIme (MAXIPIME) 2 g in sodium chloride 0.9 % 100 mL IVPB       ? 2 g ?200 mL/hr over 30 Minutes Intravenous Every 8 hours 03/24/22 1221    ? 03/22/22 1200  levofloxacin (LEVAQUIN) tablet 750 mg  Status:  Discontinued       ? 750 mg Oral Daily 03/22/22 1030 03/24/22 1221  ? ?  ? ? ?Best Practice/Protocols:  ?VTE Prophylaxis: Lovenox (prophylaxtic dose) ?Continous Sedation ? ?Consults: ?  ? ? ?Studies: ? ? ? ?Events: ? ?Subjective:  ?  ?Overnight Issues:  ? ?Objective:  ?Vital signs for last 24 hours: ?Temp:  [99.3 ?F (37.4 ?C)-101.2 ?F (38.4 ?C)] 100.3 ?F (37.9 ?C) (05/17 0800) ?Pulse Rate:  [89-112] 103 (05/17 0804) ?Resp:  [17-28] 22 (05/17 0804) ?BP: (83-151)/(54-75) 108/59 (05/16 1900) ?SpO2:  [91 %-100 %] 95 % (05/17 0804) ?Arterial Line BP: (81-169)/(39-69) 116/57 (05/17 0700) ?FiO2 (%):  [50 %-60 %] 50 % (05/17 0804) ? ?Hemodynamic parameters for last 24 hours: ?  ? ?Intake/Output from previous day: ?05/16 0701 - 05/17 0700 ?In: 2332.1 [I.V.:532; NG/GT:1500; IV Piggyback:300.1] ?Out: 1100 [Urine:950; Stool:150]  ?Intake/Output this shift: ?No intake/output data recorded. ? ?Vent settings for last 24 hours: ?Vent Mode: PRVC ?FiO2 (%):  [50 %-60 %] 50 % ?Set Rate:  [22 bmp] 22 bmp ?Vt Set:  [520 mL] 520 mL ?PEEP:  [8 cmH20-10 cmH20] 8  cmH20 ?Plateau Pressure:  [21 cmH20-25 cmH20] 21 cmH20 ? ?Physical Exam:  ?General: on vent ?Neuro: awake and F/C ?HEENT/Neck: ETT ?Resp: some rhonchi ?CVS: RRR ?GI: soft, NT ?Extremities: calves soft ? ?Results for orders placed or performed during the hospital encounter of 03/18/22 (from the past 24 hour(s))  ?I-STAT 7, (LYTES, BLD GAS, ICA, H+H)     Status: Abnormal  ? Collection Time: 03/28/22  8:40 AM  ?Result Value Ref Range  ? pH, Arterial 7.354 7.35 - 7.45  ? pCO2 arterial 67.5 (HH) 32 - 48 mmHg  ? pO2, Arterial 107 83 - 108 mmHg  ? Bicarbonate 37.5 (H) 20.0 - 28.0 mmol/L  ? TCO2 40 (H) 22 - 32 mmol/L  ? O2 Saturation 98 %  ? Acid-Base Excess 10.0 (H) 0.0 - 2.0 mmol/L  ? Sodium 140 135 - 145 mmol/L  ? Potassium 3.7 3.5 - 5.1 mmol/L  ? Calcium, Ion 1.12 (L) 1.15 - 1.40 mmol/L  ? HCT 26.0 (L) 39.0 - 52.0 %  ? Hemoglobin 8.8 (L) 13.0 - 17.0 g/dL  ? Patient temperature  99.2 F   ? Collection site RADIAL, ALLEN'S TEST ACCEPTABLE   ? Drawn by RT   ? Sample type ARTERIAL   ? Comment NOTIFIED PHYSICIAN   ?Glucose, capillary     Status: Abnormal  ? Collection Time: 03/28/22 11:10 AM  ?Result Value Ref Range  ? Glucose-Capillary 173 (H) 70 - 99 mg/dL  ?Culture, Respiratory w Gram Stain     Status: None (Preliminary result)  ? Collection Time: 03/28/22 11:16 AM  ? Specimen: Tracheal Aspirate; Respiratory  ?Result Value Ref Range  ? Specimen Description TRACHEAL ASPIRATE   ? Special Requests NONE   ? Gram Stain    ?  FEW SQUAMOUS EPITHELIAL CELLS PRESENT ?ABUNDANT WBC PRESENT,BOTH PMN AND MONONUCLEAR ?NO ORGANISMS SEEN ?Performed at [facility], [address] [place], [place] [address] ?  ? Culture PENDING   ? Report Status PENDING   ?Glucose, capillary     Status: Abnormal  ? Collection Time: 03/28/22  3:33 PM  ?Result Value Ref Range  ? Glucose-Capillary 138 (H) 70 - 99 mg/dL  ?Magnesium     Status: Abnormal  ? Collection Time: 03/28/22  4:46 PM  ?Result Value Ref Range  ? Magnesium 2.7 (H) 1.7 - 2.4 mg/dL   ?Phosphorus     Status: Abnormal  ? Collection Time: 03/28/22  4:46 PM  ?Result Value Ref Range  ? Phosphorus 1.8 (L) 2.5 - 4.6 mg/dL  ?Glucose, capillary     Status: Abnormal  ? Collection Time: 03/28/22

## 2022-03-29 NOTE — Progress Notes (Signed)
Pt placed on PSV 8/5 per wean protocol. Pt is tolerating well at this time. RN aware. RT to continue to monitor. 

## 2022-03-29 NOTE — Progress Notes (Signed)
PT Cancellation Note ? ?Patient Details ?Name: Phillip Juarez ?MRN: [id number] ?DOB: 1966/09/20 ? ? ?Cancelled Treatment:    Reason Eval/Treat Not Completed: Medical issues which prohibited therapy. Pt remains intubated, currently weaning. PT will attempt to follow up tomorrow. ? ? ?Arlyss Gandy ?03/29/2022, 4:02 PM ?

## 2022-03-30 ENCOUNTER — Inpatient Hospital Stay (HOSPITAL_COMMUNITY): Payer: Medicaid Other

## 2022-03-30 LAB — CULTURE, RESPIRATORY W GRAM STAIN

## 2022-03-30 LAB — POCT I-STAT 7, (LYTES, BLD GAS, ICA,H+H)
Acid-Base Excess: 10 mmol/L — ABNORMAL HIGH (ref 0.0–2.0)
Bicarbonate: 35.3 mmol/L — ABNORMAL HIGH (ref 20.0–28.0)
Calcium, Ion: 1.13 mmol/L — ABNORMAL LOW (ref 1.15–1.40)
HCT: 23 % — ABNORMAL LOW (ref 39.0–52.0)
Hemoglobin: 7.8 g/dL — ABNORMAL LOW (ref 13.0–17.0)
O2 Saturation: 94 %
Patient temperature: 99.5
Potassium: 4.4 mmol/L (ref 3.5–5.1)
Sodium: 144 mmol/L (ref 135–145)
TCO2: 37 mmol/L — ABNORMAL HIGH (ref 22–32)
pCO2 arterial: 52.1 mmHg — ABNORMAL HIGH (ref 32–48)
pH, Arterial: 7.441 (ref 7.35–7.45)
pO2, Arterial: 72 mmHg — ABNORMAL LOW (ref 83–108)

## 2022-03-30 LAB — CBC
HCT: 24.1 % — ABNORMAL LOW (ref 39.0–52.0)
Hemoglobin: 7.3 g/dL — ABNORMAL LOW (ref 13.0–17.0)
MCH: 30.3 pg (ref 26.0–34.0)
MCHC: 30.3 g/dL (ref 30.0–36.0)
MCV: 100 fL (ref 80.0–100.0)
Platelets: 169 10*3/uL (ref 150–400)
RBC: 2.41 MIL/uL — ABNORMAL LOW (ref 4.22–5.81)
RDW: 21.6 % — ABNORMAL HIGH (ref 11.5–15.5)
WBC: 13.6 10*3/uL — ABNORMAL HIGH (ref 4.0–10.5)
nRBC: 0 % (ref 0.0–0.2)

## 2022-03-30 LAB — BASIC METABOLIC PANEL
Anion gap: 6 (ref 5–15)
BUN: 29 mg/dL — ABNORMAL HIGH (ref 6–20)
CO2: 31 mmol/L (ref 22–32)
Calcium: 7.7 mg/dL — ABNORMAL LOW (ref 8.9–10.3)
Chloride: 106 mmol/L (ref 98–111)
Creatinine, Ser: 0.8 mg/dL (ref 0.61–1.24)
GFR, Estimated: >60 mL/min (ref >60)
Glucose, Bld: 144 mg/dL — ABNORMAL HIGH (ref 70–99)
Potassium: 4.3 mmol/L (ref 3.5–5.1)
Sodium: 143 mmol/L (ref 135–145)

## 2022-03-30 LAB — GLUCOSE, CAPILLARY
Glucose-Capillary: 135 mg/dL — ABNORMAL HIGH (ref 70–99)
Glucose-Capillary: 138 mg/dL — ABNORMAL HIGH (ref 70–99)
Glucose-Capillary: 144 mg/dL — ABNORMAL HIGH (ref 70–99)
Glucose-Capillary: 144 mg/dL — ABNORMAL HIGH (ref 70–99)
Glucose-Capillary: 149 mg/dL — ABNORMAL HIGH (ref 70–99)

## 2022-03-30 MED ORDER — FUROSEMIDE 10 MG/ML IJ SOLN
20.0000 mg | Freq: Once | INTRAMUSCULAR | Status: AC
  Administered 2022-03-30: 20 mg via INTRAVENOUS
  Filled 2022-03-30: qty 2

## 2022-03-30 NOTE — Progress Notes (Signed)
SLP Cancellation Note  Patient Details Name: MONTAVIS SCHUBRING MRN: [id number] DOB: 03/14/1966   Cancelled treatment:       Reason Eval/Treat Not Completed: Medical issues which prohibited therapy (remains on vent). Will continue to follow.    Mahala Menghini., M.A. CCC-SLP Acute Rehabilitation Services Office [phone]  Secure chat preferred  03/30/2022, 7:14 AM

## 2022-03-30 NOTE — Progress Notes (Signed)
PT Cancellation Note  Patient Details Name: Phillip Juarez MRN: [id number] DOB: [birth year]   Cancelled Treatment:    Reason Eval/Treat Not Completed: Patient declined, no reason specified. Pt appears to decline PT re-evaluation. PT asks multiple times with pt attempting to mouth words around ET tube. PT then asks pt to nod yes/no to participating in session, which pt nods no to. PT will attempt to follow up tomorrow.   Zenaida Niece 03/30/2022, 5:16 PM
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
## 2022-03-30 NOTE — TOC CAGE-AID Note (Signed)
Transition of Care [facility]) - CAGE-AID Screening   Patient Details  Name: Phillip Juarez MRN: [id number] Date of Birth: 1966/09/29  Transition of [facility]) CM/SW Contact:    Betzaida Cremeens C Tarpley-Carter, LCSWA Phone Number: 03/30/2022, 8:55 AM   Clinical Narrative: Pt is unable to participate in Cage Aid. Pt is on a full vent support.  CSW will assess at a better time.  Wyn Nettle Tarpley-Carter, MSW, LCSW-A Pronouns:  She/Her/Hers [facility] HealthTransitions of Care Clinical Social Worker Direct Number:  [phone] [email or web address]   CAGE-AID Screening: Substance Abuse Screening unable to be completed due to: : Patient unable to participate  Have You Ever Felt You Ought to Cut Down on Your Drinking or Drug Use?: Yes Have People Annoyed You By Critizing Your Drinking Or Drug Use?: No Have You Felt Bad Or Guilty About Your Drinking Or Drug Use?: No Have You Ever Had a Drink or Used Drugs First Thing In The Morning to Steady Your Nerves or to Get Rid of a Hangover?: No CAGE-AID Score: 1  Substance Abuse Education Offered: Yes  Substance abuse interventions: Patient and Family Counseling

## 2022-03-30 NOTE — Progress Notes (Signed)
Pt placed on PS/CPAP 5/8 on 40% and is tolerating well. RT will monitor.

## 2022-03-31 DIAGNOSIS — J942 Hemothorax: Secondary | ICD-10-CM | POA: Diagnosis not present

## 2022-03-31 DIAGNOSIS — S2242XA Multiple fractures of ribs, left side, initial encounter for closed fracture: Secondary | ICD-10-CM | POA: Diagnosis not present

## 2022-03-31 DIAGNOSIS — F1020 Alcohol dependence, uncomplicated: Secondary | ICD-10-CM | POA: Diagnosis not present

## 2022-03-31 LAB — CBC
HCT: 23.9 % — ABNORMAL LOW (ref 39.0–52.0)
Hemoglobin: 7 g/dL — ABNORMAL LOW (ref 13.0–17.0)
MCH: 29.8 pg (ref 26.0–34.0)
MCHC: 29.3 g/dL — ABNORMAL LOW (ref 30.0–36.0)
MCV: 101.7 fL — ABNORMAL HIGH (ref 80.0–100.0)
Platelets: 167 10*3/uL (ref 150–400)
RBC: 2.35 MIL/uL — ABNORMAL LOW (ref 4.22–5.81)
RDW: 21.9 % — ABNORMAL HIGH (ref 11.5–15.5)
WBC: 12.9 10*3/uL — ABNORMAL HIGH (ref 4.0–10.5)
nRBC: 0 % (ref 0.0–0.2)

## 2022-03-31 LAB — BASIC METABOLIC PANEL
Anion gap: 5 (ref 5–15)
BUN: 27 mg/dL — ABNORMAL HIGH (ref 6–20)
CO2: 32 mmol/L (ref 22–32)
Calcium: 7.7 mg/dL — ABNORMAL LOW (ref 8.9–10.3)
Chloride: 108 mmol/L (ref 98–111)
Creatinine, Ser: 0.73 mg/dL (ref 0.61–1.24)
GFR, Estimated: >60 mL/min (ref >60)
Glucose, Bld: 152 mg/dL — ABNORMAL HIGH (ref 70–99)
Potassium: 4.4 mmol/L (ref 3.5–5.1)
Sodium: 145 mmol/L (ref 135–145)

## 2022-03-31 LAB — GLUCOSE, CAPILLARY
Glucose-Capillary: 110 mg/dL — ABNORMAL HIGH (ref 70–99)
Glucose-Capillary: 115 mg/dL — ABNORMAL HIGH (ref 70–99)
Glucose-Capillary: 116 mg/dL — ABNORMAL HIGH (ref 70–99)
Glucose-Capillary: 127 mg/dL — ABNORMAL HIGH (ref 70–99)
Glucose-Capillary: 137 mg/dL — ABNORMAL HIGH (ref 70–99)
Glucose-Capillary: 145 mg/dL — ABNORMAL HIGH (ref 70–99)
Glucose-Capillary: 150 mg/dL — ABNORMAL HIGH (ref 70–99)

## 2022-03-31 MED ORDER — FENTANYL CITRATE PF 50 MCG/ML IJ SOSY
50.0000 ug | PREFILLED_SYRINGE | INTRAMUSCULAR | Status: DC | PRN
  Administered 2022-03-31 – 2022-04-08 (×13): 50 ug via INTRAVENOUS
  Filled 2022-03-31 (×14): qty 1

## 2022-03-31 MED ORDER — ORAL CARE MOUTH RINSE
15.0000 mL | Freq: Two times a day (BID) | OROMUCOSAL | Status: DC
  Administered 2022-04-01 – 2022-04-18 (×24): 15 mL via OROMUCOSAL

## 2022-03-31 MED ORDER — CHLORHEXIDINE GLUCONATE 0.12 % MT SOLN
15.0000 mL | Freq: Two times a day (BID) | OROMUCOSAL | Status: DC
  Administered 2022-03-31 – 2022-04-18 (×28): 15 mL via OROMUCOSAL
  Filled 2022-03-31 (×29): qty 15
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
## 2022-03-31 NOTE — Progress Notes (Signed)
OT Cancellation Note  Patient Details Name: SAINT HANK MRN: [id number] DOB: 10-21-1966   Cancelled Treatment:    Reason Eval/Treat Not Completed: Other (comment) Discussed with nsg. Plans for extubation today. Will attempt later time once extubated.  Virginio Isidore,HILLARY 03/31/2022, 8:01 AM Luisa Dago, OT/L   Acute OT Clinical Specialist Acute Rehabilitation Services Pager [phone] Office [phone]

## 2022-03-31 NOTE — Procedures (Signed)
Extubation Procedure Note  Patient Details:   Name: Phillip Juarez DOB: Aug 17, 1966 MRN: [id number]   Airway Documentation:    Vent end date: 03/31/22 Vent end time: 0815   Evaluation  O2 sats: stable throughout Complications: No apparent complications Patient did tolerate procedure well. Bilateral Breath Sounds: Clear, Diminished   Yes  Pt extubated to [place] with RN at bedside. Positive cuff leak noted and pt is tolerating well. RT will monitor.  Cathie Olden 03/31/2022, 8:16 AM

## 2022-03-31 NOTE — Progress Notes (Signed)
PT Cancellation Note  Patient Details Name: [name] MRN: [id number] DOB: 09-05-1966   Cancelled Treatment:    Reason Eval/Treat Not Completed: Patient declined, no reason specified. Pt extubated but refusing all functional mobility, not willing to follow commands for bed-level evaluation. Pt reports he recently rolled all around, is tired, and needs to rest. Pt also reports he has been doing PT for a long time and does not need any right now. He also denies being on a ventilator. PT will follow up when the patient is more agreeable to PT evaluation.   Arlyss Gandy 03/31/2022, 3:15 PM

## 2022-03-31 NOTE — TOC Progression Note (Signed)
Transition of Care [facility]) - Progression Note    Patient Details  Name: Phillip Juarez MRN: [id number] Date of Birth: 1966/08/17  Transition of Care [facility]) CM/SW Contact  Glennon Mac, RN Phone Number: 03/31/2022, 3:36 PM  Clinical Narrative:    Noted patient extubated today; refusing to work with therapies.  Anticipate need for skilled nursing facility once oxygen requirements decreased and medically stable.  TOC will follow.   Expected Discharge Plan: Skilled Nursing Facility Barriers to Discharge: Continued Medical Work up, SNF Pending bed offer  Expected Discharge Plan and Services Expected Discharge Plan: Skilled Nursing Facility   Discharge Planning Services: CM Consult   Living arrangements for the past 2 months: Single Family Home Expected Discharge Date: 03/20/22                                     Social Determinants of Health (SDOH) Interventions    Readmission Risk Interventions     View : No data to display.         Quintella Baton, RN, BSN  Trauma/Neuro ICU Case Manager [phone]

## 2022-04-01 LAB — GLUCOSE, CAPILLARY
Glucose-Capillary: 107 mg/dL — ABNORMAL HIGH (ref 70–99)
Glucose-Capillary: 127 mg/dL — ABNORMAL HIGH (ref 70–99)
Glucose-Capillary: 133 mg/dL — ABNORMAL HIGH (ref 70–99)
Glucose-Capillary: 133 mg/dL — ABNORMAL HIGH (ref 70–99)
Glucose-Capillary: 134 mg/dL — ABNORMAL HIGH (ref 70–99)
Glucose-Capillary: 145 mg/dL — ABNORMAL HIGH (ref 70–99)

## 2022-04-01 LAB — CBC
HCT: 26.2 % — ABNORMAL LOW (ref 39.0–52.0)
Hemoglobin: 7.4 g/dL — ABNORMAL LOW (ref 13.0–17.0)
MCH: 29.1 pg (ref 26.0–34.0)
MCHC: 28.2 g/dL — ABNORMAL LOW (ref 30.0–36.0)
MCV: 103.1 fL — ABNORMAL HIGH (ref 80.0–100.0)
Platelets: 177 10*3/uL (ref 150–400)
RBC: 2.54 MIL/uL — ABNORMAL LOW (ref 4.22–5.81)
RDW: 21.3 % — ABNORMAL HIGH (ref 11.5–15.5)
WBC: 11.2 10*3/uL — ABNORMAL HIGH (ref 4.0–10.5)
nRBC: 0 % (ref 0.0–0.2)

## 2022-04-01 LAB — BASIC METABOLIC PANEL
Anion gap: 4 — ABNORMAL LOW (ref 5–15)
BUN: 22 mg/dL — ABNORMAL HIGH (ref 6–20)
CO2: 34 mmol/L — ABNORMAL HIGH (ref 22–32)
Calcium: 8.2 mg/dL — ABNORMAL LOW (ref 8.9–10.3)
Chloride: 109 mmol/L (ref 98–111)
Creatinine, Ser: 0.62 mg/dL (ref 0.61–1.24)
GFR, Estimated: >60 mL/min (ref >60)
Glucose, Bld: 125 mg/dL — ABNORMAL HIGH (ref 70–99)
Potassium: 4.4 mmol/L (ref 3.5–5.1)
Sodium: 147 mmol/L — ABNORMAL HIGH (ref 135–145)

## 2022-04-01 MED ORDER — PHENYLEPHRINE HCL 1 % NA SOLN
1.0000 [drp] | Freq: Four times a day (QID) | NASAL | Status: DC | PRN
  Filled 2022-04-01: qty 15
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
## 2022-04-01 NOTE — Progress Notes (Signed)
Patient refused CPT at this time. Stated it "hurt more than it helped".
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
## 2022-04-02 LAB — BASIC METABOLIC PANEL
Anion gap: 4 — ABNORMAL LOW (ref 5–15)
BUN: 20 mg/dL (ref 6–20)
CO2: 34 mmol/L — ABNORMAL HIGH (ref 22–32)
Calcium: 8.4 mg/dL — ABNORMAL LOW (ref 8.9–10.3)
Chloride: 110 mmol/L (ref 98–111)
Creatinine, Ser: 0.58 mg/dL — ABNORMAL LOW (ref 0.61–1.24)
GFR, Estimated: >60 mL/min (ref >60)
Glucose, Bld: 146 mg/dL — ABNORMAL HIGH (ref 70–99)
Potassium: 4.4 mmol/L (ref 3.5–5.1)
Sodium: 148 mmol/L — ABNORMAL HIGH (ref 135–145)

## 2022-04-02 LAB — GLUCOSE, CAPILLARY
Glucose-Capillary: 144 mg/dL — ABNORMAL HIGH (ref 70–99)
Glucose-Capillary: 143 mg/dL — ABNORMAL HIGH (ref 70–99)
Glucose-Capillary: 136 mg/dL — ABNORMAL HIGH (ref 70–99)
Glucose-Capillary: 122 mg/dL — ABNORMAL HIGH (ref 70–99)
Glucose-Capillary: 112 mg/dL — ABNORMAL HIGH (ref 70–99)

## 2022-04-02 LAB — CBC
HCT: 27.6 % — ABNORMAL LOW (ref 39.0–52.0)
Hemoglobin: 7.9 g/dL — ABNORMAL LOW (ref 13.0–17.0)
MCH: 29.8 pg (ref 26.0–34.0)
MCHC: 28.6 g/dL — ABNORMAL LOW (ref 30.0–36.0)
MCV: 104.2 fL — ABNORMAL HIGH (ref 80.0–100.0)
Platelets: 186 10*3/uL (ref 150–400)
RBC: 2.65 MIL/uL — ABNORMAL LOW (ref 4.22–5.81)
RDW: 21 % — ABNORMAL HIGH (ref 11.5–15.5)
WBC: 12.1 10*3/uL — ABNORMAL HIGH (ref 4.0–10.5)
nRBC: 0 % (ref 0.0–0.2)
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
## 2022-04-03 DIAGNOSIS — E440 Moderate protein-calorie malnutrition: Secondary | ICD-10-CM | POA: Diagnosis not present

## 2022-04-03 DIAGNOSIS — S2242XA Multiple fractures of ribs, left side, initial encounter for closed fracture: Secondary | ICD-10-CM | POA: Diagnosis not present

## 2022-04-03 DIAGNOSIS — Z66 Do not resuscitate: Secondary | ICD-10-CM

## 2022-04-03 DIAGNOSIS — F1020 Alcohol dependence, uncomplicated: Secondary | ICD-10-CM | POA: Diagnosis not present

## 2022-04-03 LAB — BASIC METABOLIC PANEL
Anion gap: 4 — ABNORMAL LOW (ref 5–15)
BUN: 18 mg/dL (ref 6–20)
CO2: 33 mmol/L — ABNORMAL HIGH (ref 22–32)
Calcium: 8.2 mg/dL — ABNORMAL LOW (ref 8.9–10.3)
Chloride: 109 mmol/L (ref 98–111)
Creatinine, Ser: 0.56 mg/dL — ABNORMAL LOW (ref 0.61–1.24)
GFR, Estimated: >60 mL/min (ref >60)
Glucose, Bld: 123 mg/dL — ABNORMAL HIGH (ref 70–99)
Potassium: 4.1 mmol/L (ref 3.5–5.1)
Sodium: 146 mmol/L — ABNORMAL HIGH (ref 135–145)

## 2022-04-03 LAB — CBC
HCT: 25.5 % — ABNORMAL LOW (ref 39.0–52.0)
Hemoglobin: 7.5 g/dL — ABNORMAL LOW (ref 13.0–17.0)
MCH: 29.9 pg (ref 26.0–34.0)
MCHC: 29.4 g/dL — ABNORMAL LOW (ref 30.0–36.0)
MCV: 101.6 fL — ABNORMAL HIGH (ref 80.0–100.0)
Platelets: 181 10*3/uL (ref 150–400)
RBC: 2.51 MIL/uL — ABNORMAL LOW (ref 4.22–5.81)
RDW: 20.7 % — ABNORMAL HIGH (ref 11.5–15.5)
WBC: 10.2 10*3/uL (ref 4.0–10.5)
nRBC: 0 % (ref 0.0–0.2)

## 2022-04-03 LAB — GLUCOSE, CAPILLARY
Glucose-Capillary: 109 mg/dL — ABNORMAL HIGH (ref 70–99)
Glucose-Capillary: 120 mg/dL — ABNORMAL HIGH (ref 70–99)
Glucose-Capillary: 125 mg/dL — ABNORMAL HIGH (ref 70–99)
Glucose-Capillary: 139 mg/dL — ABNORMAL HIGH (ref 70–99)
Glucose-Capillary: 141 mg/dL — ABNORMAL HIGH (ref 70–99)
Glucose-Capillary: 143 mg/dL — ABNORMAL HIGH (ref 70–99)
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
## 2022-04-04 ENCOUNTER — Inpatient Hospital Stay (HOSPITAL_COMMUNITY): Payer: Medicaid Other

## 2022-04-04 LAB — BASIC METABOLIC PANEL
Anion gap: 4 — ABNORMAL LOW (ref 5–15)
BUN: 14 mg/dL (ref 6–20)
CO2: 32 mmol/L (ref 22–32)
Calcium: 8.5 mg/dL — ABNORMAL LOW (ref 8.9–10.3)
Chloride: 105 mmol/L (ref 98–111)
Creatinine, Ser: 0.44 mg/dL — ABNORMAL LOW (ref 0.61–1.24)
GFR, Estimated: >60 mL/min (ref >60)
Glucose, Bld: 118 mg/dL — ABNORMAL HIGH (ref 70–99)
Potassium: 4.4 mmol/L (ref 3.5–5.1)
Sodium: 141 mmol/L (ref 135–145)

## 2022-04-04 LAB — CBC
HCT: 26.4 % — ABNORMAL LOW (ref 39.0–52.0)
Hemoglobin: 7.8 g/dL — ABNORMAL LOW (ref 13.0–17.0)
MCH: 29.7 pg (ref 26.0–34.0)
MCHC: 29.5 g/dL — ABNORMAL LOW (ref 30.0–36.0)
MCV: 100.4 fL — ABNORMAL HIGH (ref 80.0–100.0)
Platelets: 182 10*3/uL (ref 150–400)
RBC: 2.63 MIL/uL — ABNORMAL LOW (ref 4.22–5.81)
RDW: 20.4 % — ABNORMAL HIGH (ref 11.5–15.5)
WBC: 9.5 10*3/uL (ref 4.0–10.5)
nRBC: 0 % (ref 0.0–0.2)

## 2022-04-04 LAB — GLUCOSE, CAPILLARY
Glucose-Capillary: 114 mg/dL — ABNORMAL HIGH (ref 70–99)
Glucose-Capillary: 116 mg/dL — ABNORMAL HIGH (ref 70–99)
Glucose-Capillary: 122 mg/dL — ABNORMAL HIGH (ref 70–99)
Glucose-Capillary: 132 mg/dL — ABNORMAL HIGH (ref 70–99)
Glucose-Capillary: 137 mg/dL — ABNORMAL HIGH (ref 70–99)
Glucose-Capillary: 145 mg/dL — ABNORMAL HIGH (ref 70–99)

## 2022-04-04 MED ORDER — FUROSEMIDE 10 MG/ML IJ SOLN
40.0000 mg | Freq: Once | INTRAMUSCULAR | Status: AC
  Administered 2022-04-04: 40 mg via INTRAVENOUS
  Filled 2022-04-04: qty 4

## 2022-04-04 MED FILL — Medication: Qty: 1 | Status: AC

## 2022-04-04 NOTE — Progress Notes (Addendum)
PT Cancellation Note  Patient Details Name: Phillip Juarez MRN: [id number] DOB: 12/04/1965   Cancelled Treatment:    Reason Eval/Treat Not Completed: Medical issues which prohibited therapy (Pt just received chest tube per nurse and nurse wanted to check with MD prior to PT mobilizing.) Will return as able.    Bevelyn Buckles 04/04/2022, 11:44 AM Elber Galyean M,PT Acute Rehab Services [phone] (pager)

## 2022-04-04 NOTE — Procedures (Signed)
Chest tube insertion  Date/Time: 04/04/2022 10:25 AM Performed by: Georganna Skeans, MD Authorized by: Georganna Skeans, MD   Consent:    Consent obtained:  Written   Consent given by:  Healthcare agent Pre-procedure details:    Skin preparation:  ChloraPrep   Preparation: Patient was prepped and draped in the usual sterile fashion   Anesthesia (see MAR for exact dosages):    Anesthesia method:  Local infiltration Procedure details:    Placement location:  L lateral   Tube size (Fr):  Minicatheter   Technique: Seldinger     Ultrasound guidance: no     Tension pneumothorax: no     Tube connected to:  Suction   Drainage characteristics:  Bloody   Dressing:  4x4 sterile gauze Post-procedure details:    Patient tolerance of procedure:  Tolerated well, no immediate complications Georganna Skeans, MD, MPH, FACS Please use [email or web address] to contact on call provider
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
## 2022-04-05 ENCOUNTER — Inpatient Hospital Stay (HOSPITAL_COMMUNITY): Payer: Medicaid Other

## 2022-04-05 LAB — BASIC METABOLIC PANEL
Anion gap: 4 — ABNORMAL LOW (ref 5–15)
BUN: 13 mg/dL (ref 6–20)
CO2: 33 mmol/L — ABNORMAL HIGH (ref 22–32)
Calcium: 8.4 mg/dL — ABNORMAL LOW (ref 8.9–10.3)
Chloride: 103 mmol/L (ref 98–111)
Creatinine, Ser: 0.55 mg/dL — ABNORMAL LOW (ref 0.61–1.24)
GFR, Estimated: >60 mL/min (ref >60)
Glucose, Bld: 131 mg/dL — ABNORMAL HIGH (ref 70–99)
Potassium: 4.3 mmol/L (ref 3.5–5.1)
Sodium: 140 mmol/L (ref 135–145)

## 2022-04-05 LAB — GLUCOSE, CAPILLARY
Glucose-Capillary: 114 mg/dL — ABNORMAL HIGH (ref 70–99)
Glucose-Capillary: 115 mg/dL — ABNORMAL HIGH (ref 70–99)
Glucose-Capillary: 120 mg/dL — ABNORMAL HIGH (ref 70–99)
Glucose-Capillary: 128 mg/dL — ABNORMAL HIGH (ref 70–99)
Glucose-Capillary: 138 mg/dL — ABNORMAL HIGH (ref 70–99)
Glucose-Capillary: 140 mg/dL — ABNORMAL HIGH (ref 70–99)

## 2022-04-05 LAB — CBC
HCT: 25.9 % — ABNORMAL LOW (ref 39.0–52.0)
Hemoglobin: 7.6 g/dL — ABNORMAL LOW (ref 13.0–17.0)
MCH: 29.5 pg (ref 26.0–34.0)
MCHC: 29.3 g/dL — ABNORMAL LOW (ref 30.0–36.0)
MCV: 100.4 fL — ABNORMAL HIGH (ref 80.0–100.0)
Platelets: 184 10*3/uL (ref 150–400)
RBC: 2.58 MIL/uL — ABNORMAL LOW (ref 4.22–5.81)
RDW: 20 % — ABNORMAL HIGH (ref 11.5–15.5)
WBC: 11.3 10*3/uL — ABNORMAL HIGH (ref 4.0–10.5)
nRBC: 0 % (ref 0.0–0.2)

## 2022-04-05 MED ORDER — FUROSEMIDE 10 MG/ML IJ SOLN
40.0000 mg | Freq: Once | INTRAMUSCULAR | Status: AC
  Administered 2022-04-05: 40 mg via INTRAVENOUS
  Filled 2022-04-05: qty 4
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
## 2022-04-06 LAB — GLUCOSE, CAPILLARY
Glucose-Capillary: 141 mg/dL — ABNORMAL HIGH (ref 70–99)
Glucose-Capillary: 144 mg/dL — ABNORMAL HIGH (ref 70–99)
Glucose-Capillary: 110 mg/dL — ABNORMAL HIGH (ref 70–99)
Glucose-Capillary: 147 mg/dL — ABNORMAL HIGH (ref 70–99)
Glucose-Capillary: 128 mg/dL — ABNORMAL HIGH (ref 70–99)

## 2022-04-06 MED ORDER — PROSOURCE TF PO LIQD
45.0000 mL | Freq: Every day | ORAL | Status: DC
Start: 2022-04-07 — End: 2022-04-10
  Administered 2022-04-07 – 2022-04-09 (×3): 45 mL
  Filled 2022-04-06 (×3): qty 45

## 2022-04-06 MED ORDER — ENSURE ENLIVE PO LIQD
237.0000 mL | Freq: Two times a day (BID) | ORAL | Status: DC
  Administered 2022-04-06 – 2022-04-09 (×7): 237 mL via ORAL

## 2022-04-06 MED ORDER — OSMOLITE 1.5 CAL PO LIQD
996.0000 mL | ORAL | Status: DC
  Filled 2022-04-06 (×2): qty 1000

## 2022-04-06 MED ORDER — OSMOLITE 1.5 CAL PO LIQD
996.0000 mL | ORAL | Status: DC
  Administered 2022-04-06 – 2022-04-09 (×3): 1000 mL
  Filled 2022-04-06 (×8): qty 1000
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
## 2022-04-06 NOTE — Progress Notes (Signed)
Patient with transfer orders to 4NP. Report given to [facility] RN. All belongings gathered. Sister aware of transfer off unit.

## 2022-04-06 NOTE — Progress Notes (Signed)
PT Cancellation Note  Patient Details Name: Phillip Juarez MRN: [id number] DOB: Oct 08, 1966   Cancelled Treatment:    Reason Eval/Treat Not Completed: Fatigue/lethargy limiting ability to participate. Pt reports he is trying to rest and is tired, declines PT at this time. PT will attempt to follow up as time allows.   Arlyss Gandy 04/06/2022, 4:09 PM

## 2022-04-06 NOTE — Plan of Care (Signed)

## 2022-04-07 ENCOUNTER — Inpatient Hospital Stay (HOSPITAL_COMMUNITY): Payer: Medicaid Other

## 2022-04-07 LAB — BASIC METABOLIC PANEL
Anion gap: 6 (ref 5–15)
BUN: 10 mg/dL (ref 6–20)
CO2: 29 mmol/L (ref 22–32)
Calcium: 8 mg/dL — ABNORMAL LOW (ref 8.9–10.3)
Chloride: 104 mmol/L (ref 98–111)
Creatinine, Ser: 0.51 mg/dL — ABNORMAL LOW (ref 0.61–1.24)
GFR, Estimated: >60 mL/min (ref >60)
Glucose, Bld: 128 mg/dL — ABNORMAL HIGH (ref 70–99)
Potassium: 4.2 mmol/L (ref 3.5–5.1)
Sodium: 139 mmol/L (ref 135–145)

## 2022-04-07 LAB — CBC
HCT: 24.6 % — ABNORMAL LOW (ref 39.0–52.0)
Hemoglobin: 7.5 g/dL — ABNORMAL LOW (ref 13.0–17.0)
MCH: 29.6 pg (ref 26.0–34.0)
MCHC: 30.5 g/dL (ref 30.0–36.0)
MCV: 97.2 fL (ref 80.0–100.0)
Platelets: 175 10*3/uL (ref 150–400)
RBC: 2.53 MIL/uL — ABNORMAL LOW (ref 4.22–5.81)
RDW: 19.7 % — ABNORMAL HIGH (ref 11.5–15.5)
WBC: 8.8 10*3/uL (ref 4.0–10.5)
nRBC: 0 % (ref 0.0–0.2)

## 2022-04-07 LAB — GLUCOSE, CAPILLARY
Glucose-Capillary: 100 mg/dL — ABNORMAL HIGH (ref 70–99)
Glucose-Capillary: 115 mg/dL — ABNORMAL HIGH (ref 70–99)
Glucose-Capillary: 140 mg/dL — ABNORMAL HIGH (ref 70–99)
Glucose-Capillary: 145 mg/dL — ABNORMAL HIGH (ref 70–99)
Glucose-Capillary: 149 mg/dL — ABNORMAL HIGH (ref 70–99)
Glucose-Capillary: 86 mg/dL (ref 70–99)

## 2022-04-07 NOTE — Progress Notes (Signed)
Brief Nutrition Note  RD received consult for 48-hour calorie count. Discussed plan with RN. RD to hang calorie count envelope on the patient's door. RN and/or NT to document percent consumed for each item on the patient's meal tray ticket and keep in envelope. RN and/or NT to also document percent of any supplement or snack pt consumes and keep documentation in envelope for RD to review.  Plan to start calorie count at 1100 today with lunch meal. RD to follow up on Monday, 04/10/22, with calorie count results.  Plan to continue nocturnal tube feeding as ordered at this time.   Gustavus Bryant, MS, RD, LDN Inpatient Clinical Dietitian Please see AMiON for contact information.

## 2022-04-07 NOTE — Progress Notes (Signed)
  Mobility Specialist Criteria Algorithm Info.   04/07/22 1045  Mobility  Activity Ambulated with assistance in room;Transferred from chair to bed  Range of Motion/Exercises Active;All extremities  Level of Assistance +2 (takes two people)  Information systems manager Ambulated (ft) 5 ft  Activity Response Tolerated well   Patient received in recliner chair requesting assistance back to bed. Stood mod A and took steps from recliner to bed. Required +2 for safety due to multiple lines. Returned to bed without complaint or incident. Was left in supine with all needs met, call bell in reach.   04/07/2022 12:24 PM  Phillip Juarez, [name], [name]  IFOYD:[phone] Office: [phone]
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
## 2022-04-08 ENCOUNTER — Inpatient Hospital Stay (HOSPITAL_COMMUNITY): Payer: Medicaid Other

## 2022-04-08 DIAGNOSIS — S2242XA Multiple fractures of ribs, left side, initial encounter for closed fracture: Secondary | ICD-10-CM | POA: Diagnosis not present

## 2022-04-08 DIAGNOSIS — J90 Pleural effusion, not elsewhere classified: Secondary | ICD-10-CM

## 2022-04-08 DIAGNOSIS — F1020 Alcohol dependence, uncomplicated: Secondary | ICD-10-CM | POA: Diagnosis not present

## 2022-04-08 LAB — GLUCOSE, CAPILLARY
Glucose-Capillary: 130 mg/dL — ABNORMAL HIGH (ref 70–99)
Glucose-Capillary: 96 mg/dL (ref 70–99)
Glucose-Capillary: 101 mg/dL — ABNORMAL HIGH (ref 70–99)
Glucose-Capillary: 110 mg/dL — ABNORMAL HIGH (ref 70–99)
Glucose-Capillary: 106 mg/dL — ABNORMAL HIGH (ref 70–99)
Glucose-Capillary: 92 mg/dL (ref 70–99)
Glucose-Capillary: 137 mg/dL — ABNORMAL HIGH (ref 70–99)

## 2022-04-08 LAB — CBC
HCT: 25.8 % — ABNORMAL LOW (ref 39.0–52.0)
Hemoglobin: 7.9 g/dL — ABNORMAL LOW (ref 13.0–17.0)
MCH: 30 pg (ref 26.0–34.0)
MCHC: 30.6 g/dL (ref 30.0–36.0)
MCV: 98.1 fL (ref 80.0–100.0)
Platelets: 175 10*3/uL (ref 150–400)
RBC: 2.63 MIL/uL — ABNORMAL LOW (ref 4.22–5.81)
RDW: 19.2 % — ABNORMAL HIGH (ref 11.5–15.5)
WBC: 8.8 10*3/uL (ref 4.0–10.5)
nRBC: 0 % (ref 0.0–0.2)

## 2022-04-08 LAB — BASIC METABOLIC PANEL
Anion gap: 6 (ref 5–15)
BUN: 11 mg/dL (ref 6–20)
CO2: 28 mmol/L (ref 22–32)
Calcium: 7.9 mg/dL — ABNORMAL LOW (ref 8.9–10.3)
Chloride: 100 mmol/L (ref 98–111)
Creatinine, Ser: 0.52 mg/dL — ABNORMAL LOW (ref 0.61–1.24)
GFR, Estimated: >60 mL/min (ref >60)
Glucose, Bld: 100 mg/dL — ABNORMAL HIGH (ref 70–99)
Potassium: 3.9 mmol/L (ref 3.5–5.1)
Sodium: 134 mmol/L — ABNORMAL LOW (ref 135–145)

## 2022-04-08 NOTE — Progress Notes (Signed)
Pt Chest tube removed per order and protocol. VSS and clean sterile dsg applied. Catheter tip intact with no discharge. Will continue to closely monitor pt. Francis Gaines Jemiah Ellenburg RN   04/08/22 1245  Vitals  Temp 98.9 F (37.2 C)  Temp Source Oral  BP 101/65  MAP (mmHg) 74  BP Location Left Arm  BP Method Automatic  Patient Position (if appropriate) Lying  Pulse Rate 98  Pulse Rate Source Monitor  ECG Heart Rate 99  Resp 16  Level of Consciousness  Level of Consciousness Alert  MEWS COLOR  MEWS Score Color Green  Oxygen Therapy  SpO2 94 %  O2 Device HFNC  O2 Flow Rate (L/min) 5 L/min  MEWS Score  MEWS Temp 0  MEWS Systolic 0  MEWS Pulse 0  MEWS RR 0  MEWS LOC 0  MEWS Score 0
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
## 2022-04-09 LAB — BASIC METABOLIC PANEL
Anion gap: 5 (ref 5–15)
BUN: 11 mg/dL (ref 6–20)
CO2: 30 mmol/L (ref 22–32)
Calcium: 8.3 mg/dL — ABNORMAL LOW (ref 8.9–10.3)
Chloride: 102 mmol/L (ref 98–111)
Creatinine, Ser: 0.55 mg/dL — ABNORMAL LOW (ref 0.61–1.24)
GFR, Estimated: >60 mL/min (ref >60)
Glucose, Bld: 131 mg/dL — ABNORMAL HIGH (ref 70–99)
Potassium: 4.1 mmol/L (ref 3.5–5.1)
Sodium: 137 mmol/L (ref 135–145)

## 2022-04-09 LAB — GLUCOSE, CAPILLARY
Glucose-Capillary: 131 mg/dL — ABNORMAL HIGH (ref 70–99)
Glucose-Capillary: 110 mg/dL — ABNORMAL HIGH (ref 70–99)
Glucose-Capillary: 105 mg/dL — ABNORMAL HIGH (ref 70–99)
Glucose-Capillary: 114 mg/dL — ABNORMAL HIGH (ref 70–99)
Glucose-Capillary: 114 mg/dL — ABNORMAL HIGH (ref 70–99)
Glucose-Capillary: 126 mg/dL — ABNORMAL HIGH (ref 70–99)

## 2022-04-09 LAB — CBC
HCT: 26 % — ABNORMAL LOW (ref 39.0–52.0)
Hemoglobin: 7.9 g/dL — ABNORMAL LOW (ref 13.0–17.0)
MCH: 30 pg (ref 26.0–34.0)
MCHC: 30.4 g/dL (ref 30.0–36.0)
MCV: 98.9 fL (ref 80.0–100.0)
Platelets: 171 10*3/uL (ref 150–400)
RBC: 2.63 MIL/uL — ABNORMAL LOW (ref 4.22–5.81)
RDW: 18.9 % — ABNORMAL HIGH (ref 11.5–15.5)
WBC: 7.5 10*3/uL (ref 4.0–10.5)
nRBC: 0 % (ref 0.0–0.2)
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
## 2022-04-10 LAB — GLUCOSE, CAPILLARY
Glucose-Capillary: 107 mg/dL — ABNORMAL HIGH (ref 70–99)
Glucose-Capillary: 118 mg/dL — ABNORMAL HIGH (ref 70–99)
Glucose-Capillary: 121 mg/dL — ABNORMAL HIGH (ref 70–99)
Glucose-Capillary: 131 mg/dL — ABNORMAL HIGH (ref 70–99)
Glucose-Capillary: 137 mg/dL — ABNORMAL HIGH (ref 70–99)
Glucose-Capillary: 140 mg/dL — ABNORMAL HIGH (ref 70–99)
Glucose-Capillary: 39 mg/dL — CL (ref 70–99)

## 2022-04-10 MED ORDER — GABAPENTIN 300 MG PO CAPS
300.0000 mg | ORAL_CAPSULE | Freq: Three times a day (TID) | ORAL | Status: DC
  Administered 2022-04-10 – 2022-04-18 (×25): 300 mg via ORAL
  Filled 2022-04-10 (×25): qty 1

## 2022-04-10 MED ORDER — GUAIFENESIN 100 MG/5ML PO LIQD
15.0000 mL | Freq: Four times a day (QID) | ORAL | Status: DC
  Administered 2022-04-10 – 2022-04-18 (×31): 15 mL via ORAL
  Filled 2022-04-10 (×27): qty 20
  Filled 2022-04-10: qty 15
  Filled 2022-04-10 (×2): qty 20
  Filled 2022-04-10: qty 15
  Filled 2022-04-10 (×3): qty 20

## 2022-04-10 MED ORDER — ACETAMINOPHEN 500 MG PO TABS
1000.0000 mg | ORAL_TABLET | Freq: Four times a day (QID) | ORAL | Status: DC
  Administered 2022-04-10 – 2022-04-18 (×30): 1000 mg via ORAL
  Filled 2022-04-10 (×30): qty 2

## 2022-04-10 MED ORDER — THIAMINE HCL 100 MG/ML IJ SOLN: 100.0000 mg | Freq: Every day | INTRAMUSCULAR | Status: DC

## 2022-04-10 MED ORDER — POLYETHYLENE GLYCOL 3350 17 G PO PACK
17.0000 g | PACK | Freq: Every day | ORAL | Status: DC
  Administered 2022-04-11 – 2022-04-16 (×6): 17 g via ORAL
  Filled 2022-04-10 (×8): qty 1

## 2022-04-10 MED ORDER — ADULT MULTIVITAMIN W/MINERALS CH
1.0000 | ORAL_TABLET | Freq: Every day | ORAL | Status: DC
  Administered 2022-04-10 – 2022-04-18 (×9): 1 via ORAL
  Filled 2022-04-10 (×9): qty 1

## 2022-04-10 MED ORDER — ENSURE ENLIVE PO LIQD
237.0000 mL | Freq: Three times a day (TID) | ORAL | Status: DC
  Administered 2022-04-10 – 2022-04-18 (×25): 237 mL via ORAL
  Filled 2022-04-10: qty 237

## 2022-04-10 MED ORDER — DOCUSATE SODIUM 100 MG PO CAPS
100.0000 mg | ORAL_CAPSULE | Freq: Two times a day (BID) | ORAL | Status: DC
  Administered 2022-04-10 – 2022-04-18 (×15): 100 mg via ORAL
  Filled 2022-04-10 (×17): qty 1

## 2022-04-10 MED ORDER — THIAMINE HCL 100 MG PO TABS
100.0000 mg | ORAL_TABLET | Freq: Every day | ORAL | Status: DC
  Administered 2022-04-10 – 2022-04-18 (×9): 100 mg via ORAL
  Filled 2022-04-10 (×8): qty 1

## 2022-04-10 MED ORDER — METHOCARBAMOL 500 MG PO TABS
1000.0000 mg | ORAL_TABLET | Freq: Four times a day (QID) | ORAL | Status: DC
  Administered 2022-04-10 – 2022-04-18 (×33): 1000 mg via ORAL
  Filled 2022-04-10 (×33): qty 2

## 2022-04-10 MED ORDER — FUROSEMIDE 20 MG PO TABS
20.0000 mg | ORAL_TABLET | Freq: Every day | ORAL | Status: DC
  Administered 2022-04-10 – 2022-04-18 (×9): 20 mg via ORAL
  Filled 2022-04-10 (×9): qty 1

## 2022-04-10 MED ORDER — OXYCODONE HCL 5 MG PO TABS
5.0000 mg | ORAL_TABLET | ORAL | Status: DC | PRN
  Administered 2022-04-10 – 2022-04-13 (×14): 10 mg via ORAL
  Administered 2022-04-15: 5 mg via ORAL
  Administered 2022-04-15: 10 mg via ORAL
  Administered 2022-04-16 (×3): 5 mg via ORAL
  Administered 2022-04-17 – 2022-04-18 (×3): 10 mg via ORAL
  Filled 2022-04-10: qty 1
  Filled 2022-04-10 (×7): qty 2
  Filled 2022-04-10: qty 1
  Filled 2022-04-10: qty 2
  Filled 2022-04-10: qty 1
  Filled 2022-04-10 (×5): qty 2
  Filled 2022-04-10: qty 1
  Filled 2022-04-10 (×6): qty 2

## 2022-04-10 MED ORDER — QUETIAPINE FUMARATE 50 MG PO TABS
25.0000 mg | ORAL_TABLET | Freq: Two times a day (BID) | ORAL | Status: DC
  Administered 2022-04-10 – 2022-04-18 (×17): 25 mg via ORAL
  Filled 2022-04-10 (×17): qty 1

## 2022-04-10 MED ORDER — ALPRAZOLAM 0.25 MG PO TABS
1.0000 mg | ORAL_TABLET | Freq: Two times a day (BID) | ORAL | Status: DC
  Administered 2022-04-10 – 2022-04-18 (×17): 1 mg via ORAL
  Filled 2022-04-10 (×17): qty 4

## 2022-04-10 MED ORDER — FOLIC ACID 1 MG PO TABS
1.0000 mg | ORAL_TABLET | Freq: Every day | ORAL | Status: DC
  Administered 2022-04-10 – 2022-04-18 (×9): 1 mg via ORAL
  Filled 2022-04-10 (×9): qty 1

## 2022-04-10 MED ORDER — AMITRIPTYLINE HCL 25 MG PO TABS
50.0000 mg | ORAL_TABLET | Freq: Every evening | ORAL | Status: DC
  Administered 2022-04-10 – 2022-04-17 (×8): 50 mg via ORAL
  Filled 2022-04-10 (×8): qty 2
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
## 2022-04-10 NOTE — Progress Notes (Signed)
Calorie Count Note  48-hour calorie count ordered. Calorie count was started on 04/07/22 at 1100 with lunch meal. Please see day 1 and day 2 results below.  Diet: dysphagia 2 diet with thin liquids then advanced to dysphagia 3 diet with thin liquids on 5/27 Supplements:  - Ensure Enlive BID (increased to TID this morning)  Day 1: 5/26 Lunch: 715 kcal, 33 grams of protein 5/26 Dinner: no documentation available 5/27 Breakfast: 745 kcal, 29 grams of protein Supplements: 1050 kcal, 60 grams of protein  Day 1 total 24-hour intake: 2510 kcal (>100% of minimum estimated needs)  122 grams of protein (>100% of minimum estimated needs)   Day 2: 5/27 Lunch: 466 kcal, 15 grams of protein 5/27 Dinner: 195 kcal, 8 grams of protein 5/28 Breakfast: 200 kcal, 12 grams of protein Supplements: 1050 kcal, 60 grams of protein  Day 2 total 24-hour intake: 1911 kcal (87% of minimum estimated needs)  95 grams of protein (86% of minimum estimated needs)  Nutrition Diagnosis: Moderate Malnutrition related to social / environmental circumstances (alcohol abuse) as evidenced by mild muscle depletion, mild fat depletion.  Goal:  Patient will meet greater than or equal to 90% of their needs.  Intervention:  - d/c calorie count - Continue Ensure Enlive TID - Continue MVI with minerals daily - d/c tube feeding orders and Cortrak per Surgery PA   Mertie Clause, MS, RD, LDN Inpatient Clinical Dietitian Please see AMiON for contact information.
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
## 2022-04-10 NOTE — TOC Progression Note (Addendum)
Transition of Care [facility]) - Progression Note    Patient Details  Name: Phillip Juarez MRN: [id number] Date of Birth: 08-31-1966  Transition of Care [facility]) CM/SW Contact  Beckie Busing, RN Phone [phone]  04/10/2022, 10:09 AM  Clinical Narrative:    TOC continues to follow. Remains on HFNC 4L, ambulating short distances, palliative consulted for goals of care. TOC will continue to follow for disposition planning.    Expected Discharge Plan: Skilled Nursing Facility Barriers to Discharge: Continued Medical Work up, SNF Pending bed offer  Expected Discharge Plan and Services Expected Discharge Plan: Skilled Nursing Facility   Discharge Planning Services: CM Consult   Living arrangements for the past 2 months: Single Family Home Expected Discharge Date: 03/20/22                                     Social Determinants of Health (SDOH) Interventions    Readmission Risk Interventions     View : No data to display.

## 2022-04-10 NOTE — TOC CAGE-AID Note (Signed)
Transition of Care [facility]) - CAGE-AID Screening   Patient Details  Name: Phillip Juarez MRN: [id number] Date of Birth: May 30, 1966  Transition of Care [facility]) CM/SW Contact:    Teshaun Olarte C Tarpley-Carter, LCSWA Phone Number: 04/10/2022, 12:00 PM   Clinical Narrative: Pt participated in Cage-Aid.  Pt stated he does use substance.  Pt was offered resources, due to usage of substance.    Shawnay Bramel Tarpley-Carter, MSW, LCSW-A Pronouns:  She/Her/Hers [facility] Clinical Social Worker Direct Number:  [phone] [email or web address]  CAGE-AID Screening: Substance Abuse Screening unable to be completed due to: : Patient unable to participate  Have You Ever Felt You Ought to Cut Down on Your Drinking or Drug Use?: Yes Have People Annoyed You By Critizing Your Drinking Or Drug Use?: No Have You Felt Bad Or Guilty About Your Drinking Or Drug Use?: No Have You Ever Had a Drink or Used Drugs First Thing In The Morning to Steady Your Nerves or to Get Rid of a Hangover?: No CAGE-AID Score: 1  Substance Abuse Education Offered: Yes  Substance abuse interventions: Transport planner

## 2022-04-11 LAB — GLUCOSE, CAPILLARY
Glucose-Capillary: 106 mg/dL — ABNORMAL HIGH (ref 70–99)
Glucose-Capillary: 115 mg/dL — ABNORMAL HIGH (ref 70–99)
Glucose-Capillary: 133 mg/dL — ABNORMAL HIGH (ref 70–99)

## 2022-04-11 NOTE — Progress Notes (Signed)
  Mobility Specialist Criteria Algorithm Info.   04/11/22 1035  Oxygen Therapy  SpO2 (!) 85 %  O2 Device Nasal Cannula  Patient Activity (if Appropriate) Ambulating  Mobility  Activity Ambulated with assistance in hallway;Dangled on edge of bed;Ambulated with assistance in room  Range of Motion/Exercises Active;All extremities  Level of Assistance Contact guard assist, steadying assist (cues to stand w/upright erect posture and stay proximal to RW)  Assistive Device Front wheel walker  Distance Ambulated (ft) 115 ft  Activity Response Tolerated well; RN notified   Patient received in supine eager and motivated to participate in mobility. Requested to wheel around unit in [facility], awaiting permission from MD. Completed re-education on energy conservation, incentive spirometer and pursed lip breathing with receptive teach back. Progressing well with mobility, tolerated increased distance without S/Sx. Ambulated in hallway min guard with slow steady gait. Required multiple standing rest recovery breaks. Needed frequent cues for pursed lip breathing. SpO2 desaturated during ambulation, lingering 82-86% on 4LO2. Per advisement of RN, increased supplemental O2 to 6L in which he was able to maintain >86%. Returned to room without complaint or incident. Was left dangling EOB with all needs met, call bell in reach.   04/11/2022 12:41 PM  [place] Lekha Dancer, [place], [place]  EZBMZ:[phone] Office: [phone]

## 2022-04-11 NOTE — Progress Notes (Signed)
OT Cancellation Note  Patient Details Name: Phillip Juarez MRN: [id number] DOB: [birth year]   Cancelled Treatment:    Reason Eval/Treat Not Completed: Fatigue/lethargy limiting ability to participate;Other (comment) pt reports having just walked with mobility tech and reports his stomach is upset, offered toileting with pt declining, will f/u as time allows for OT session.  Lenor Derrick., COTA/L Acute Rehabilitation Services [phone]   Barron Schmid 04/11/2022, 1:03 PM

## 2022-04-11 NOTE — TOC Progression Note (Signed)
Transition of Care [facility]) - Progression Note    Patient Details  Name: Phillip Juarez MRN: [id number] Date of Birth: 10-06-1966  [facility] CM/SW Contact  Eduard Roux, [place] Phone Number: 04/11/2022, 12:01 PM  Clinical Narrative:     Spoke with Allison/[facility]- she will review and inform CSW.   Antony Blackbird, MSW, LCSW Clinical Social Worker    Expected Discharge Plan: Skilled Nursing Facility Barriers to Discharge: Continued Medical Work up, SNF Pending bed offer  Expected Discharge Plan and Services Expected Discharge Plan: Skilled Nursing Facility   Discharge Planning Services: CM Consult   Living arrangements for the past 2 months: Single Family Home Expected Discharge Date: 03/20/22                                     Social Determinants of Health (SDOH) Interventions    Readmission Risk Interventions     View : No data to display.
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
## 2022-04-12 LAB — GLUCOSE, CAPILLARY
Glucose-Capillary: 101 mg/dL — ABNORMAL HIGH (ref 70–99)
Glucose-Capillary: 104 mg/dL — ABNORMAL HIGH (ref 70–99)
Glucose-Capillary: 108 mg/dL — ABNORMAL HIGH (ref 70–99)
Glucose-Capillary: 116 mg/dL — ABNORMAL HIGH (ref 70–99)
Glucose-Capillary: 120 mg/dL — ABNORMAL HIGH (ref 70–99)
Glucose-Capillary: 133 mg/dL — ABNORMAL HIGH (ref 70–99)
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
## 2022-04-12 NOTE — Progress Notes (Signed)
  Mobility Specialist Criteria Algorithm Info.   04/12/22 1016  Oxygen Therapy  SpO2 (!) 87 %  O2 Device Nasal Cannula  Patient Activity (if Appropriate) Ambulating  Pulse Oximetry Type Continuous  Mobility  Activity Ambulated with assistance in hallway;Ambulated independently to bathroom  Range of Motion/Exercises Active;All extremities  Level of Assistance Standby assist, set-up cues, supervision of patient - no hands on  Assistive Device Front wheel walker  Distance Ambulated (ft) 150 ft (10f+50+50+30+50+25+20)  Activity Response Tolerated well   HR pre: 104 HR during:120 HR post: 109  Patient received in bed eager and motivated to participate in mobility. Requested to attempt further ambulation on mutual assent to participate in PT/OT session later today. Ambulated in hallway supervision level with slow steady gait. Tolerated ambulating on 4LO2 better than prior session. Needed multiple standing rest breaks to maintain an SpO2 > 87%. Returned to room without complaint or incident. Patient returned to bed then requested assistance to bathroom for BM. Was supervision for toileting but requires min A for pericare. Returned to bed and was left in supine with all needs met, call bell in reach.    04/12/2022 11:36 AM  JMartiniqueTripp, CVista BLake Dallas PYXAJL:[phone]: [phone]

## 2022-04-12 NOTE — TOC Progression Note (Addendum)
Transition of Care [facility]) - Progression Note    Patient Details  Name: PRINTICE HELLMER MRN: [id number] Date of Birth: 06-05-1966  [facility]) CM/SW Contact  Astrid Drafts Berna Spare, RN Phone Number: 04/12/2022, 5:38 PM  Clinical Narrative:    Per psych MD, patient does not have capacity to make decisions regarding discharge disposition.  Patient is still being reviewed by [facility] and Rehab for potential admission.  Noted patient continues on 4 L of nasal oxygen, with desaturations during ambulation.  Will follow-up with reviewing SNF in a.m.   Expected Discharge Plan: Skilled Nursing Facility Barriers to Discharge: Continued Medical Work up, SNF Pending bed offer  Expected Discharge Plan and Services Expected Discharge Plan: Skilled Nursing Facility   Discharge Planning Services: CM Consult   Living arrangements for the past 2 months: Single Family Home Expected Discharge Date: 03/20/22                                     Social Determinants of Health (SDOH) Interventions    Readmission Risk Interventions     View : No data to display.         Quintella Baton, RN, BSN  Trauma/Neuro ICU Case Manager [phone]

## 2022-04-13 LAB — GLUCOSE, CAPILLARY
Glucose-Capillary: 101 mg/dL — ABNORMAL HIGH (ref 70–99)
Glucose-Capillary: 111 mg/dL — ABNORMAL HIGH (ref 70–99)
Glucose-Capillary: 112 mg/dL — ABNORMAL HIGH (ref 70–99)
Glucose-Capillary: 114 mg/dL — ABNORMAL HIGH (ref 70–99)
Glucose-Capillary: 121 mg/dL — ABNORMAL HIGH (ref 70–99)
Glucose-Capillary: 129 mg/dL — ABNORMAL HIGH (ref 70–99)

## 2022-04-13 NOTE — TOC Progression Note (Signed)
Transition of Care [facility]) - Progression Note    Patient Details  Name: Phillip Juarez MRN: [id number] Date of Birth: 06-19-1966  Transition of [facility]) CM/SW Contact  Astrid Drafts Berna Spare, RN Phone Number: 04/13/2022, 4:52 PM  Clinical Narrative:    Sherron Monday with Revonda Standard at [facility]; she is still reviewing patient referral with her director.  She has concerns that patient does not have a skillable need for SNF.  Left message for [name] at [facility], requesting additional review.   Expected Discharge Plan: Skilled Nursing Facility Barriers to Discharge: Continued Medical Work up, SNF Pending bed offer  Expected Discharge Plan and Services Expected Discharge Plan: Skilled Nursing Facility   Discharge Planning Services: CM Consult   Living arrangements for the past 2 months: Single Family Home Expected Discharge Date: 03/20/22                                     Social Determinants of Health (SDOH) Interventions    Readmission Risk Interventions     View : No data to display.         Quintella Baton, RN, BSN  Trauma/Neuro ICU Case Manager [phone]
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
## 2022-04-13 NOTE — Progress Notes (Signed)
  Mobility Specialist Criteria Algorithm Info.   04/13/22 1245  Mobility  Activity Ambulated with assistance in hallway  Range of Motion/Exercises Active;All extremities  Level of Assistance Contact guard assist, steadying assist  Assistive Device Front wheel walker  Distance Ambulated (ft) 420 ft  Activity Response Tolerated well   Patient received in supine eager to participate in mobility. Ambulated in hallway min guard with slow steady gait. Required standing rest recovery x4. SpO2 maintained >88% on 4LO2 throughout. Returned to room without complaint or incident. Was left standing at the bedside with all needs met and OT present.  04/13/2022 5:25 PM  Martinique Leon Montoya, [place], [place]  EUMPN:[phone] Office: [phone]

## 2022-04-13 NOTE — Progress Notes (Signed)
Trauma Event Note    TRN at bedside to round, primary nurse at bedside. Pt stable, VS WDL no needs at this time.  Last imported Vital Signs BP 122/62 (BP Location: Left Arm)   Pulse (!) 102   Temp 98.7 F (37.1 C) (Oral)   Resp 20   Ht 5\' 7"  (1.702 m)   Wt 251 lb 8.7 oz (114.1 kg)   SpO2 94%   BMI 39.40 kg/m     Neziah Vogelgesang O Kaari Zeigler  Trauma Response RN  Please call TRN at [phone] for further assistance.
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
## 2022-04-14 LAB — GLUCOSE, CAPILLARY
Glucose-Capillary: 100 mg/dL — ABNORMAL HIGH (ref 70–99)
Glucose-Capillary: 106 mg/dL — ABNORMAL HIGH (ref 70–99)
Glucose-Capillary: 120 mg/dL — ABNORMAL HIGH (ref 70–99)
Glucose-Capillary: 155 mg/dL — ABNORMAL HIGH (ref 70–99)
Glucose-Capillary: 99 mg/dL (ref 70–99)
Glucose-Capillary: 99 mg/dL (ref 70–99)

## 2022-04-14 NOTE — TOC Progression Note (Signed)
Transition of Care [facility]) - Progression Note    Patient Details  Name: Phillip Juarez MRN: [id number] Date of Birth: [birth year]  [facility] CM/SW Contact  Astrid Drafts Berna Spare, RN Phone Number: 04/14/2022, 4:38 PM  Clinical Narrative:    Left messages for Teena at [facility] and Revonda Standard at [facility], requesting update.     Expected Discharge Plan: Skilled Nursing Facility Barriers to Discharge: Continued Medical Work up, SNF Pending bed offer  Expected Discharge Plan and Services Expected Discharge Plan: Skilled Nursing Facility   Discharge Planning Services: CM Consult   Living arrangements for the past 2 months: Single Family Home Expected Discharge Date: 03/20/22                                     Social Determinants of Health (SDOH) Interventions    Readmission Risk Interventions     View : No data to display.         Quintella Baton, RN, BSN  Trauma/Neuro ICU Case Manager [phone]
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
## 2022-04-14 NOTE — Progress Notes (Signed)
  Mobility Specialist Criteria Algorithm Info.    04/14/22 1708  Mobility  Activity Ambulated with assistance in hallway  Range of Motion/Exercises Active;All extremities  Level of Assistance Standby assist, set-up cues, supervision of patient - no hands on  Assistive Device Front wheel walker  Distance Ambulated (ft) 1400 ft  Activity Response Tolerated well   Patient received in supine agreeable to participate in mobility. Ambulated in hallway supervision level with slow steady gait. Required cues to stay proximal to RW. Returned to room without complaint or incident. Was left in bed with all needs met, call bell in reach. RN present.  04/14/2022 5:08 PM  [place], [place], [place]  TKCXW:[phone] Office: [phone]
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
## 2022-04-15 LAB — GLUCOSE, CAPILLARY
Glucose-Capillary: 117 mg/dL — ABNORMAL HIGH (ref 70–99)
Glucose-Capillary: 135 mg/dL — ABNORMAL HIGH (ref 70–99)
Glucose-Capillary: 120 mg/dL — ABNORMAL HIGH (ref 70–99)
Glucose-Capillary: 111 mg/dL — ABNORMAL HIGH (ref 70–99)
Glucose-Capillary: 117 mg/dL — ABNORMAL HIGH (ref 70–99)
Glucose-Capillary: 95 mg/dL (ref 70–99)
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
## 2022-04-16 LAB — GLUCOSE, CAPILLARY
Glucose-Capillary: 108 mg/dL — ABNORMAL HIGH (ref 70–99)
Glucose-Capillary: 109 mg/dL — ABNORMAL HIGH (ref 70–99)
Glucose-Capillary: 114 mg/dL — ABNORMAL HIGH (ref 70–99)
Glucose-Capillary: 117 mg/dL — ABNORMAL HIGH (ref 70–99)
Glucose-Capillary: 95 mg/dL (ref 70–99)
Glucose-Capillary: 98 mg/dL (ref 70–99)
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
## 2022-04-16 NOTE — Progress Notes (Addendum)
Mobility Specialist Progress Note   04/16/22 1600  Mobility  Activity Ambulated with assistance in hallway  Level of Assistance Standby assist, set-up cues, supervision of patient - no hands on  Assistive Device Front wheel walker  Distance Ambulated (ft) 980 ft  Activity Response Tolerated well  $Mobility charge 1 Mobility   Pre Mobility: 105 HR, 92% SpO2 on 3LO2 During Mobility: 126 HR, 86% SpO2 on 3LO2 Post Mobility: 110 HR, 90% SpO2 on 3LO2  Received pt in bed having no complaints and agreeable. Requiring cues on proper posture bc pt tends to favor this flexed trunk position. SpO2 also dropped to 86% while on 3LO2 but quickly recovered w/ pursed lip breathing. Otherwise, ambulated w/o fault and returned B2B w/ call bell in reach.   Frederico Hamman Mobility Specialist Phone Number [phone]

## 2022-04-17 LAB — COMPREHENSIVE METABOLIC PANEL
ALT: 16 U/L (ref 0–44)
AST: 27 U/L (ref 15–41)
Albumin: 2.3 g/dL — ABNORMAL LOW (ref 3.5–5.0)
Alkaline Phosphatase: 81 U/L (ref 38–126)
Anion gap: 7 (ref 5–15)
BUN: 8 mg/dL (ref 6–20)
CO2: 27 mmol/L (ref 22–32)
Calcium: 9 mg/dL (ref 8.9–10.3)
Chloride: 106 mmol/L (ref 98–111)
Creatinine, Ser: 0.54 mg/dL — ABNORMAL LOW (ref 0.61–1.24)
GFR, Estimated: >60 mL/min (ref >60)
Glucose, Bld: 110 mg/dL — ABNORMAL HIGH (ref 70–99)
Potassium: 4 mmol/L (ref 3.5–5.1)
Sodium: 140 mmol/L (ref 135–145)
Total Bilirubin: 0.7 mg/dL (ref 0.3–1.2)
Total Protein: 7.2 g/dL (ref 6.5–8.1)

## 2022-04-17 LAB — GLUCOSE, CAPILLARY
Glucose-Capillary: 116 mg/dL — ABNORMAL HIGH (ref 70–99)
Glucose-Capillary: 101 mg/dL — ABNORMAL HIGH (ref 70–99)
Glucose-Capillary: 128 mg/dL — ABNORMAL HIGH (ref 70–99)
Glucose-Capillary: 131 mg/dL — ABNORMAL HIGH (ref 70–99)
Glucose-Capillary: 116 mg/dL — ABNORMAL HIGH (ref 70–99)

## 2022-04-17 MED ORDER — IBUPROFEN 200 MG PO TABS
600.0000 mg | ORAL_TABLET | Freq: Four times a day (QID) | ORAL | Status: DC
  Administered 2022-04-17 – 2022-04-18 (×5): 600 mg via ORAL
  Filled 2022-04-17 (×5): qty 3

## 2022-04-17 MED ORDER — ENOXAPARIN SODIUM 40 MG/0.4ML IJ SOSY
40.0000 mg | PREFILLED_SYRINGE | Freq: Two times a day (BID) | INTRAMUSCULAR | Status: DC
  Administered 2022-04-17 – 2022-04-18 (×2): 40 mg via SUBCUTANEOUS
  Filled 2022-04-17 (×2): qty 0.4
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
## 2022-04-17 NOTE — TOC Progression Note (Signed)
Transition of Care [facility]) - Progression Note    Patient Details  Name: Phillip Juarez MRN: [id number] Date of Birth: 11-20-1965  Transition of Care [facility]) CM/SW Contact  Glennon Mac, RN Phone Number: 04/17/2022, 12:04 PM  Clinical Narrative:    Patient's only offer for SNF is [facility]now [facility]); spoke with [name] in admissions at facility, and currently no male bed available.  Admissions director states she will call me when something becomes available.    Expected Discharge Plan: Skilled Nursing Facility Barriers to Discharge: Continued Medical Work up, SNF Pending bed offer  Expected Discharge Plan and Services Expected Discharge Plan: Skilled Nursing Facility   Discharge Planning Services: CM Consult   Living arrangements for the past 2 months: Single Family Home Expected Discharge Date: 03/20/22                                     Social Determinants of Health (SDOH) Interventions    Readmission Risk Interventions     View : No data to display.         Quintella Baton, RN, BSN  Trauma/Neuro ICU Case Manager [phone]

## 2022-04-17 NOTE — Progress Notes (Addendum)
  Mobility Specialist Criteria Algorithm Info.    04/17/22 1026  Mobility  Activity Ambulated with assistance in hallway  Range of Motion/Exercises Active;All extremities  Level of Assistance Standby assist, set-up cues, supervision of patient - no hands on  Assistive Device Front wheel walker  Distance Ambulated (ft) 1500 ft  Activity Response Tolerated well   Pre Mobility: HR 103; SpO2 94% on 3LO2 During Mobility: HR 128-135; SpO2 88% on 3LO2 Post Mobility: HR 120; SpO2 93% on 3LO2   Patient received in supine agreeable to participate in mobility. Ambulated in hallway supervision level with slow gait. Required x4 standing rest recovery breaks to conserve energy. Also needed cues to stand tall to achieve erect walking/standing posture. HR elevated peaking at 135 during ambulation and SpO2 fluctuated 88-92% on 3LO2. Asx throughout. Returned to room without complaint or incident. Was left in bed with all needs met, call bell in reach.   04/17/2022 3:07 PM  [place] Terrel [place], [place], [place]  DODQV:[phone] Office: [phone]

## 2022-04-18 LAB — GLUCOSE, CAPILLARY
Glucose-Capillary: 108 mg/dL — ABNORMAL HIGH (ref 70–99)
Glucose-Capillary: 100 mg/dL — ABNORMAL HIGH (ref 70–99)
Glucose-Capillary: 144 mg/dL — ABNORMAL HIGH (ref 70–99)
Glucose-Capillary: 105 mg/dL — ABNORMAL HIGH (ref 70–99)

## 2022-04-18 LAB — SARS CORONAVIRUS 2 BY RT PCR: SARS Coronavirus 2 by RT PCR: NEGATIVE

## 2022-04-18 MED ORDER — ACETAMINOPHEN 500 MG PO TABS: 1000.0000 mg | ORAL_TABLET | Freq: Four times a day (QID) | ORAL | 0 refills | Status: AC

## 2022-04-18 MED ORDER — GUAIFENESIN 100 MG/5ML PO LIQD: 15.0000 mL | Freq: Four times a day (QID) | ORAL | 0 refills | Status: AC

## 2022-04-18 MED ORDER — LIDOCAINE 5 % EX PTCH: 1.0000 | MEDICATED_PATCH | CUTANEOUS | 0 refills | Status: AC

## 2022-04-18 MED ORDER — ADULT MULTIVITAMIN W/MINERALS CH: 1.0000 | ORAL_TABLET | Freq: Every day | ORAL | Status: AC

## 2022-04-18 MED ORDER — POLYETHYLENE GLYCOL 3350 17 G PO PACK: 17.0000 g | PACK | Freq: Every day | ORAL | 0 refills | Status: AC

## 2022-04-18 MED ORDER — CALCIUM CARBONATE ANTACID 500 MG PO CHEW: 1.0000 | CHEWABLE_TABLET | Freq: Three times a day (TID) | ORAL | Status: AC | PRN

## 2022-04-18 MED ORDER — QUETIAPINE FUMARATE 25 MG PO TABS: 25.0000 mg | ORAL_TABLET | Freq: Two times a day (BID) | ORAL | Status: AC

## 2022-04-18 MED ORDER — DOCUSATE SODIUM 100 MG PO CAPS: 100.0000 mg | ORAL_CAPSULE | Freq: Two times a day (BID) | ORAL | 0 refills | Status: AC

## 2022-04-18 MED ORDER — THIAMINE HCL 100 MG PO TABS: 100.0000 mg | ORAL_TABLET | Freq: Every day | ORAL | Status: AC

## 2022-04-18 MED ORDER — OXYCODONE HCL 10 MG PO TABS: 5.0000 mg | ORAL_TABLET | Freq: Four times a day (QID) | ORAL | 0 refills | Status: AC | PRN

## 2022-04-18 MED ORDER — IBUPROFEN 600 MG PO TABS: 600.0000 mg | ORAL_TABLET | Freq: Four times a day (QID) | ORAL | 0 refills | Status: AC

## 2022-04-18 MED ORDER — METHOCARBAMOL 1000 MG PO TABS: 1000.0000 mg | ORAL_TABLET | Freq: Four times a day (QID) | ORAL | Status: AC | PRN

## 2022-04-18 NOTE — Progress Notes (Signed)
Attempted to call report to [facility] [phone], was placed on hold for 18 minutes. Will attempt again when PTAR arrives
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
## 2022-04-18 NOTE — Progress Notes (Signed)
  Mobility Specialist Criteria Algorithm Info.    04/18/22 1517  Mobility  Activity Ambulated with assistance in hallway;Dangled on edge of bed  Range of Motion/Exercises Active;All extremities  Level of Assistance Standby assist, set-up cues, supervision of patient - no hands on  Assistive Device Front wheel walker  Distance Ambulated (ft) 980 ft  Activity Response Tolerated well   Patient received in bed agreeable to participate in mobility. Ambulated in hallway with slow steady gait. Did better with adhering to staying proximal and standing tall while ambulating. Returned to room without complaint or incident. Was left in bed with all needs met, call bell in reach.  04/18/2022 3:18 PM  Phillip Juarez, [place], [place]  TGPQD:[phone] Office: [phone]
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
## 2022-04-18 NOTE — TOC Transition Note (Addendum)
Transition of Care [facility]) - CM/SW Discharge Note   Patient Details  Name: Phillip Juarez MRN: [id number] Date of Birth: 09-19-1966  Transition of Care [facility]) CM/SW Contact:  Glennon Mac, RN Phone Number: 04/18/2022, 2:43 PM   Clinical Narrative:    Pt medically stable for discharge today and has been accepted for admission to [facility].  PA/MD notified, will initiate discharge summary.  Bedside nurse to call report to [phone]; will go to room 107.  Pt and POA, [name] (sister) accepting of bed offer and are agreeable to discharge plan. Sister to go to facility on 04/19/22 to sign paperwork, and Teena in admissions is accepting of this.  Discharge summary and transfer report forwarded to [facility].  PTAR notified for transport at 1450pm.    1530 Addendum:  Received call from [name], patient's sister, stating that they are not happy with facility reviews, and that it is not in network with pt's Medicaid. Patient's other sister, [name], was agreeable per our earlier phone conversation.  Patient is medically stable for discharge, and has discharge order; he has no other bed offers, and family is unable to provide assistance at home.  Confirmed with [name] that [facility] authorization has been received, and that facility is prepared to accept patient. She has documented time she called and the person she spoke with at [facility]; I have called sister back and explained this to her. She states she will call facility herself, and I have given her admissions director name for reference.  [name] is aware that transport has been called for pickup.    1550 Addendum: Reviewed patient case/sister's concerns with Nehemiah Settle, [facility] Advanced Care Supervisor; advised to proceed with transport.     Final next level of care: Skilled Nursing Facility Barriers to Discharge: Barriers Resolved   Patient Goals and CMS Choice Patient states their goals for  this hospitalization and ongoing recovery are:: to go home CMS Medicare.gov Compare Post Acute Care list provided to:: Patient Represenative (must comment) (POA [name], sister) Choice offered to / list presented to : [facility] POA / Guardian  Discharge Placement PASRR number recieved: 04/18/22            Patient chooses bed at: Other - please specify in the comment section below: [name]) Patient to be transferred to facility by: PTAR Name of family member notified: [name] (sister) Patient and family notified of of transfer: 04/18/22  Discharge Plan and Services   Discharge Planning Services: CM Consult                                 Social Determinants of Health (SDOH) Interventions     Readmission Risk Interventions     View : No data to display.         Quintella Baton, RN, BSN  Trauma/Neuro ICU Case Manager [phone]
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
## 2022-04-18 NOTE — Progress Notes (Signed)
PT Cancellation Note  Patient Details Name: Phillip Juarez MRN: [id number] DOB: 28-Jan-1966   Cancelled Treatment:    Reason Eval/Treat Not Completed: Other (comment); patient working with mobility specialist and planned d/c today.  Will defer treatment to next venue.    Reginia Naas 04/18/2022, 4:20 PM Magda Kiel, PT Acute Rehabilitation Services [id number] Office:[phone] 04/18/2022

## 2022-10-19 ENCOUNTER — Emergency Department
Admission: EM | Admit: 2022-10-19 | Discharge: 2022-10-19 | Disposition: A | Discharge status: Home / Self Care | Payer: Medicaid Other | Attending: Emergency Medicine | Admitting: Emergency Medicine

## 2022-10-19 ENCOUNTER — Emergency Department: Payer: Medicaid Other

## 2022-10-19 DIAGNOSIS — M545 Low back pain, unspecified: Secondary | ICD-10-CM | POA: Diagnosis not present

## 2022-10-19 DIAGNOSIS — F1090 Alcohol use, unspecified, uncomplicated: Secondary | ICD-10-CM

## 2022-10-19 DIAGNOSIS — W19XXXA Unspecified fall, initial encounter: Secondary | ICD-10-CM | POA: Insufficient documentation

## 2022-10-19 DIAGNOSIS — F1020 Alcohol dependence, uncomplicated: Secondary | ICD-10-CM | POA: Diagnosis not present

## 2022-10-19 DIAGNOSIS — F10230 Alcohol dependence with withdrawal, uncomplicated: Secondary | ICD-10-CM | POA: Insufficient documentation

## 2022-10-19 LAB — CBC
HCT: 43.8 % (ref 39.0–52.0)
Hemoglobin: 13.4 g/dL (ref 13.0–17.0)
MCH: 28 pg (ref 26.0–34.0)
MCHC: 30.6 g/dL (ref 30.0–36.0)
MCV: 91.4 fL (ref 80.0–100.0)
Platelets: 55 10*3/uL — ABNORMAL LOW (ref 150–400)
RBC: 4.79 MIL/uL (ref 4.22–5.81)
RDW: 19.9 % — ABNORMAL HIGH (ref 11.5–15.5)
WBC: 5.5 10*3/uL (ref 4.0–10.5)
nRBC: 0 % (ref 0.0–0.2)

## 2022-10-19 LAB — COMPREHENSIVE METABOLIC PANEL
ALT: 18 U/L (ref 0–44)
AST: 72 U/L — ABNORMAL HIGH (ref 15–41)
Albumin: 2.8 g/dL — ABNORMAL LOW (ref 3.5–5.0)
Alkaline Phosphatase: 78 U/L (ref 38–126)
Anion gap: 7 (ref 5–15)
BUN: 7 mg/dL (ref 6–20)
CO2: 23 mmol/L (ref 22–32)
Calcium: 7.3 mg/dL — ABNORMAL LOW (ref 8.9–10.3)
Chloride: 111 mmol/L (ref 98–111)
Creatinine, Ser: 0.33 mg/dL — ABNORMAL LOW (ref 0.61–1.24)
GFR, Estimated: >60 mL/min (ref >60)
Glucose, Bld: 92 mg/dL (ref 70–99)
Potassium: 3.3 mmol/L — ABNORMAL LOW (ref 3.5–5.1)
Sodium: 141 mmol/L (ref 135–145)
Total Bilirubin: 1.9 mg/dL — ABNORMAL HIGH (ref 0.3–1.2)
Total Protein: 6.4 g/dL — ABNORMAL LOW (ref 6.5–8.1)

## 2022-10-19 LAB — TROPONIN I (HIGH SENSITIVITY): Troponin I (High Sensitivity): 4 ng/L (ref <18)

## 2022-10-19 LAB — CK: Total CK: 179 U/L (ref 49–397)

## 2022-10-19 MED ORDER — SODIUM CHLORIDE 0.9 % IV BOLUS
1000.0000 mL | Freq: Once | INTRAVENOUS | Status: AC
  Administered 2022-10-19: 1000 mL via INTRAVENOUS

## 2022-10-19 MED ORDER — LORAZEPAM 2 MG/ML IJ SOLN
1.0000 mg | Freq: Once | INTRAMUSCULAR | Status: AC
  Administered 2022-10-19: 1 mg via INTRAVENOUS
  Filled 2022-10-19: qty 1

## 2022-10-19 NOTE — ED Provider Notes (Signed)
[facility] Provider Note    Event Date/Time   First MD Initiated Contact with Patient 10/19/22 1310     (approximate)  History   Chief Complaint: Fall  HPI  Phillip Juarez is a 56 y.o. male with a past medical history of alcohol abuse, anxiety, presents to the emergency department after a fall last night.  According to the patient around 7 PM he had a fall last night, and is complaining of pain to his lower back and somewhat to his neck.  His c-collar was placed by EMS.  Patient states due to the pain in his lower back he was unable to get up last night so he spent the remainder of the night on the floor.  Here the patient describing mild to moderate lower back pain is his only complaint denies any neck pain, c-collar is in place.  Denies any chest or abdominal pain.  Has good range of motion in all extremities.  Physical Exam   Triage Vital Signs: ED Triage Vitals  Enc Vitals Group     BP --      Pulse Rate 10/19/22 1239 97     Resp --      Temp 10/19/22 1239 (!) 97.5 F (36.4 C)     Temp Source 10/19/22 1239 Oral     SpO2 10/19/22 1237 93 %     Weight 10/19/22 1242 230 lb 2.6 oz (104.4 kg)     Height 10/19/22 1242 5\' 7"  (1.702 m)     Head Circumference --      Peak Flow --      Pain Score 10/19/22 1240 10     Pain Loc --      Pain Edu? --      Excl. in GC? --     Most recent vital signs: Vitals:   10/19/22 1237 10/19/22 1239  Pulse:  97  Temp:  (!) 97.5 F (36.4 C)  SpO2: 93% 93%    General: Awake, no distress.  CV:  Good peripheral perfusion.  Regular rate and rhythm  Resp:  Normal effort.  Equal breath sounds bilaterally.  Abd:  No distention.  Soft, nontender.  No rebound or guarding.   ED Results / Procedures / Treatments   EKG  EKG viewed and interpreted by myself shows atrial fibrillation per sinus rhythm at 94 bpm with a narrow QRS, normal axis, normal intervals, nonspecific ST changes.  EKG appears most consistent with a  sinus rhythm I believe the electrical artifact largely due to the patient being mildly tremulous.  RADIOLOGY  I have reviewed and interpreted the CT head images.  No large bleed seen on my evaluation. Radiology is read the CT scan of the head C-spine T and L-spine is negative for acute abnormality.   MEDICATIONS ORDERED IN ED: Medications  sodium chloride 0.9 % bolus 1,000 mL (1,000 mLs Intravenous New Bag/Given 10/19/22 1343)  LORazepam (ATIVAN) injection 1 mg (1 mg Intravenous Given 10/19/22 1343)     IMPRESSION / MDM / ASSESSMENT AND PLAN / ED COURSE  I reviewed the triage vital signs and the nursing notes.  Patient's presentation is most consistent with acute presentation with potential threat to life or bodily function.  Patient presents to the emergency department after a fall last night unable to get up due to back pain so he spent the night on the floor.  We will check labs including a CK to rule out rhabdomyolysis.  Will obtain CT  imaging of the head and CT and L-spine.  Denies any chest abdominal pelvis pain.  Good range of motion in extremities.  Patient is somewhat tremulous suspect a degree of alcohol withdrawal.  States his last drink was last night around 6 or 7 PM.  We will dose 1 mg of IV Ativan.  We will dose IV fluids.  Will check labs CT imaging and continue to closely monitor.  Patient agreeable to plan of care.  Lab work has resulted largely nonrevealing.  Negative troponin, reassuring chemistry including normal renal function, reassuring CBC, normal CK.  Patient CT scan does not show any acute traumatic injury.  I remove the patient's c-collar.  Patient satting in the upper 90s.  States he is feeling much better and is ready to go home.  Patient is asking for something to eat and drink.  Given the patient's reassuring workup I believe the patient will be safe for discharge home with outpatient follow-up.  Patient agreeable to plan.  Discussed return precautions.  FINAL  CLINICAL IMPRESSION(S) / ED DIAGNOSES   Fall Alcohol abuse Alcohol withdrawal    Note:  This document was prepared using Dragon voice recognition software and may include unintentional dictation errors.   Minna Antis, MD 10/19/22 1515

## 2022-10-19 NOTE — ED Triage Notes (Signed)
Pt arrived to ED via [facility].Pt fell around 7:00pm last night. Pt reports back and neck pain. Placed in C collar by EMS, no deformities noted.Chronic ETOH use reported by neighbor.

## 2022-11-04 IMAGING — CT CT CHEST W/O CM
2 of 3 series · 15 of 36 positions shown, 18 images · non-contrast
Comparison: CT chest, abdomen and pelvis dated March 24, 2022

CLINICAL DATA: Abnormal chest radiograph



[Series 3: chest w/o 2mm st · axial · non-contrast · 0.87mm/px · z∈[+1202,+1478]mm · 12 of 162 slices shown, 15 images]
[im 12/162  mediastinal]
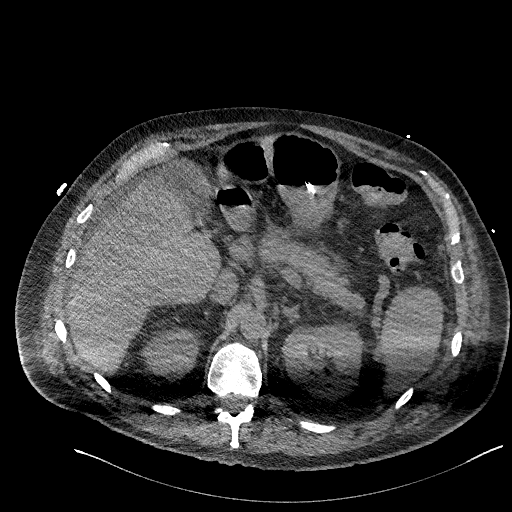
[im 12/162  lung]
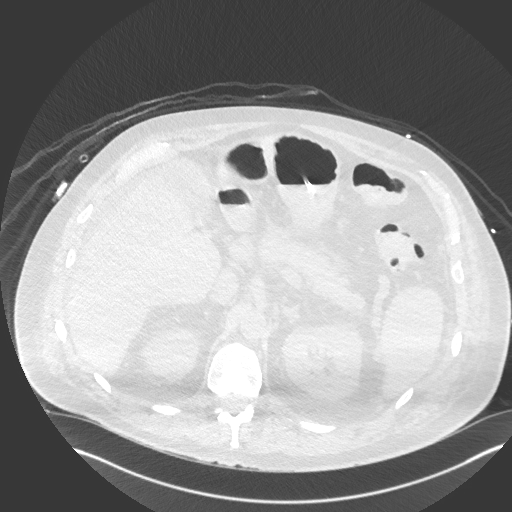
[im 24/162  lung]
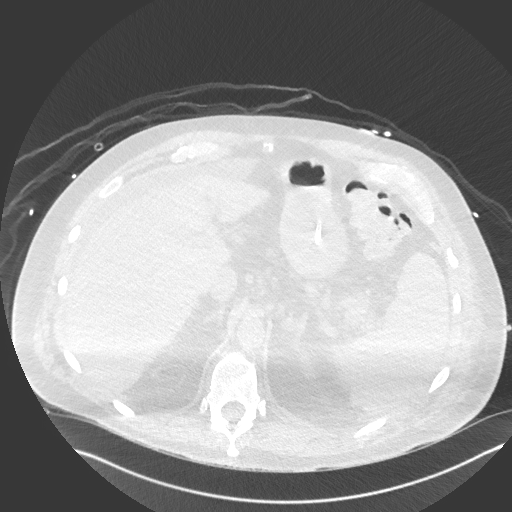
[im 36/162  lung]
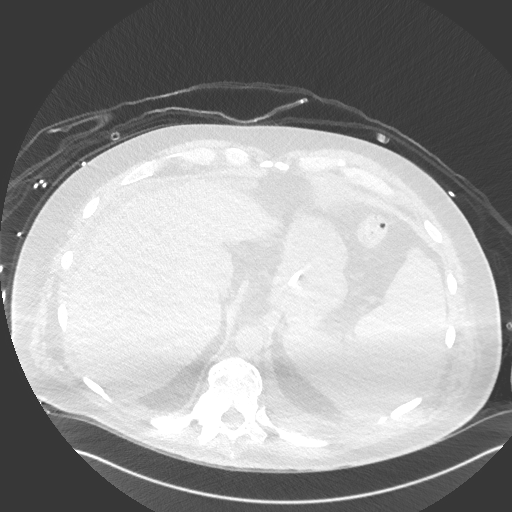
[im 48/162  lung]
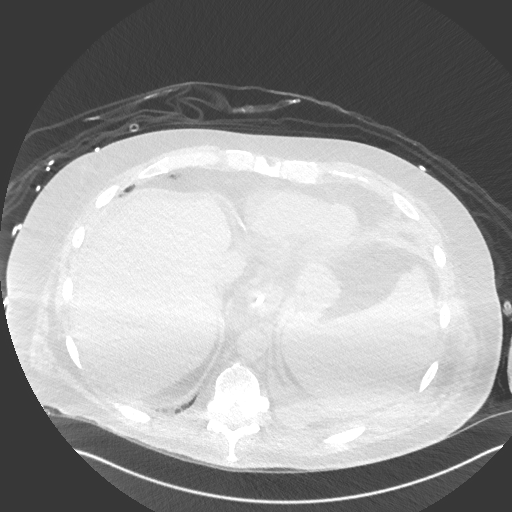
[im 60/162  mediastinal]
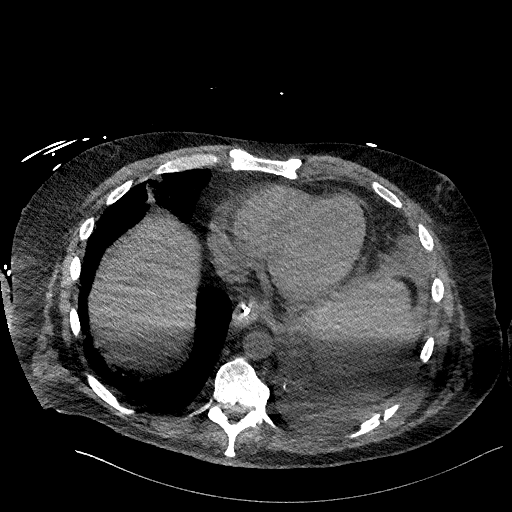
[im 60/162  lung]
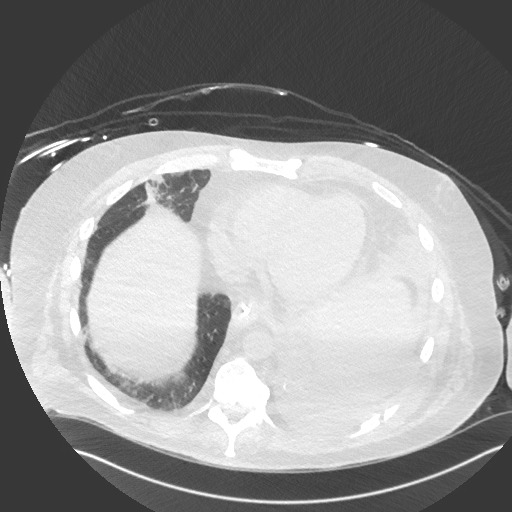
[im 72/162  lung]
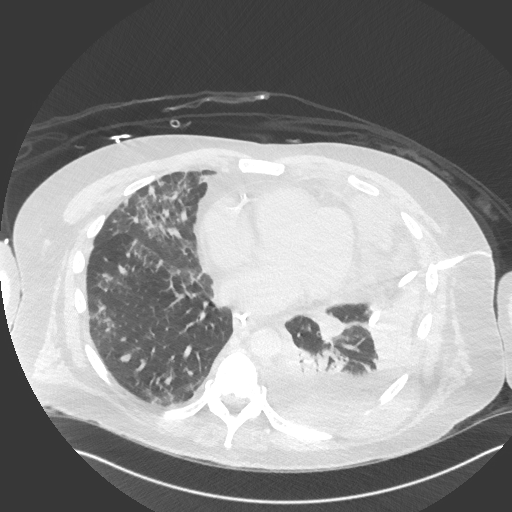
[im 90/162  lung]
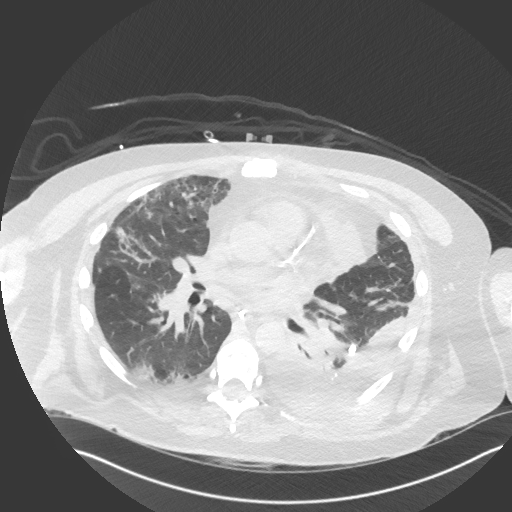
[im 102/162  lung]
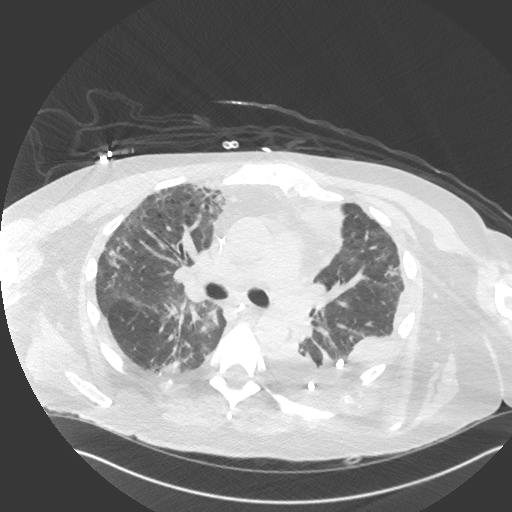
[im 114/162  mediastinal]
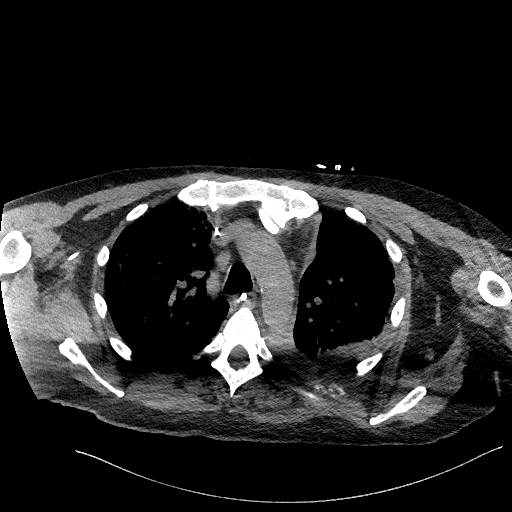
[im 114/162  lung]
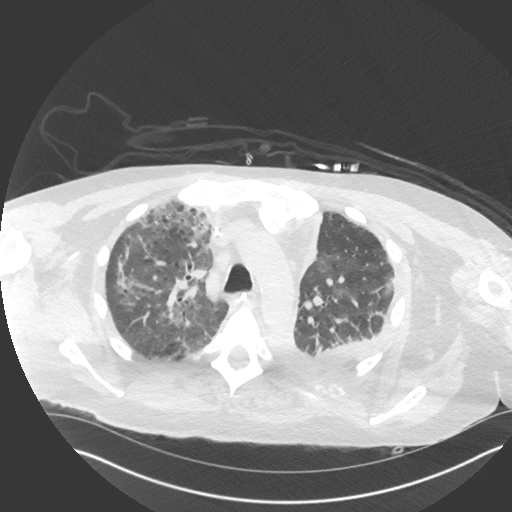
[im 126/162  lung]
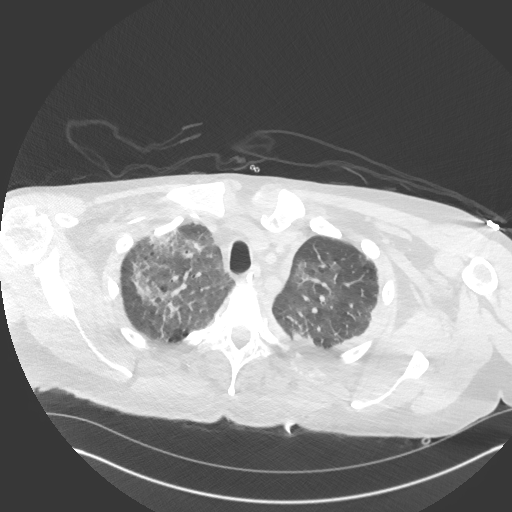
[im 138/162  lung]
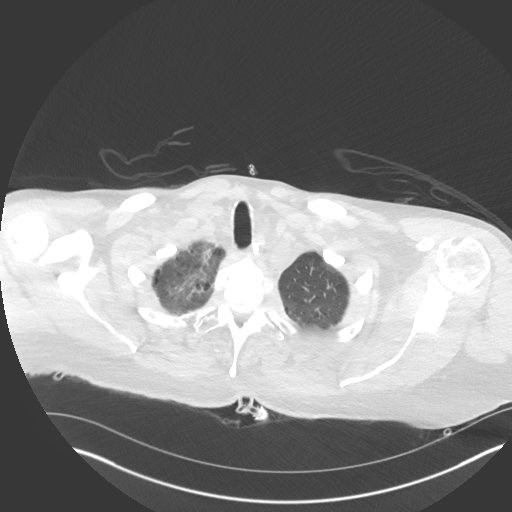
[im 150/162  lung]
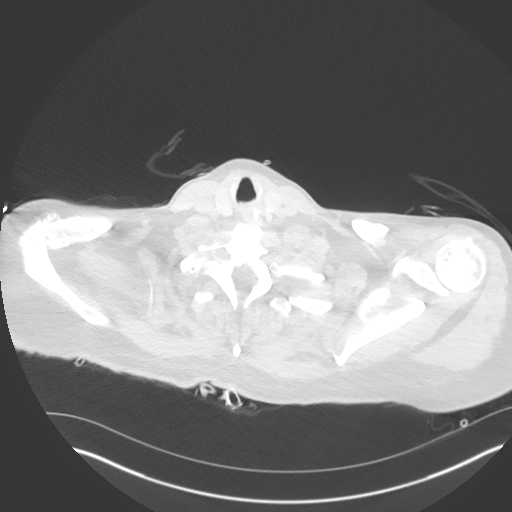

[Series 6: chest w/o 2mm st cor · coronal · non-contrast · 0.67mm/px · 3 of 169 slices shown]
[im 34/169  lung]
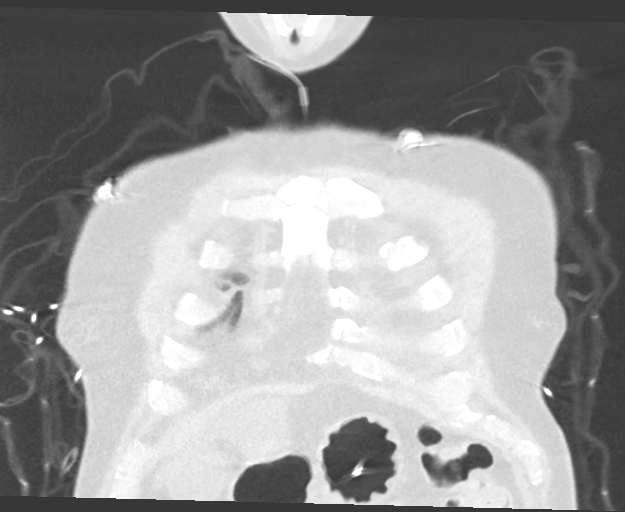
[im 68/169  lung]
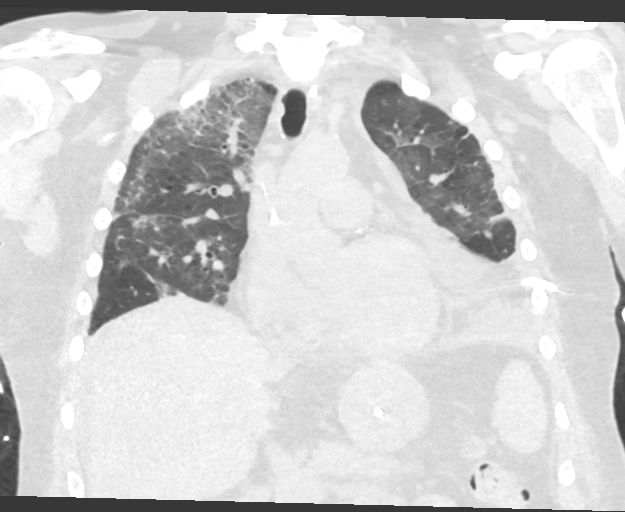
[im 101/169  lung]
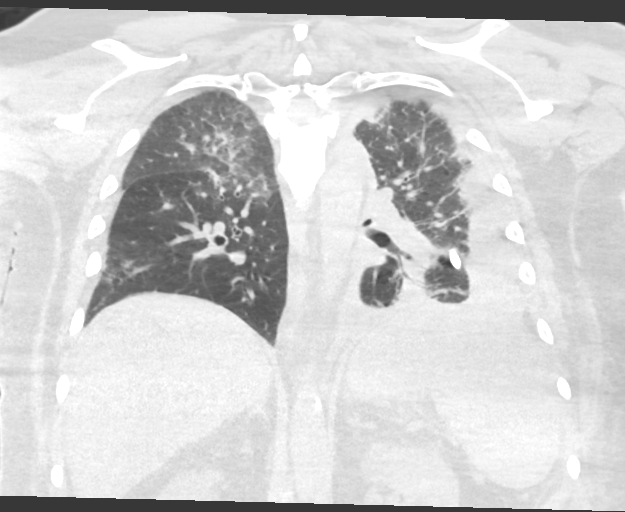

[15 of 36 positions shown; findings below may reference images not displayed]

FINDINGS: Cardiovascular: Normal heart size. Pericardial effusion. Severe
coronary artery calcifications of the left main lad and RCA. Normal
caliber thoracic aorta with atherosclerotic disease. Right arm PICC
with tip in the lower SVC.

Mediastinum/Nodes: Small hiatal hernia. Thyroid is unremarkable.
Pathologically enlarged lymph nodes seen in the chest.

Lungs/Pleura: Central airways are patent. Mild centrilobular
emphysema. Decreased bilateral ground-glass opacities. Small
loculated left pleural effusion with chest tube in place, decreased
in size when compared with prior CT. Bibasilar atelectasis.

Upper Abdomen: Gastric decompression tube is in the body of the
stomach. Cholelithiasis. Trace perihepatic fluid.

Musculoskeletal: Multiple posterior left rib fractures again seen.
IMPRESSION: 1. Decreased bilateral ground-glass opacities, likely resolving
infectious or inflammatory process.
2. Small loculated left pleural effusion with chest tube in place,
decreased in size when compared with prior CT.
3. Cholelithiasis.
4. Aortic Atherosclerosis ([id number]) and Emphysema ([id number]).

## 2022-11-05 IMAGING — DX DG CHEST 1V PORT
1 series · 1 of 1 positions shown · non-contrast
Comparison: AP chest 04/07/2022

CLINICAL DATA: Encounter for chest tube removal.

EXAM:
PORTABLE CHEST 1 VIEW

[chest]
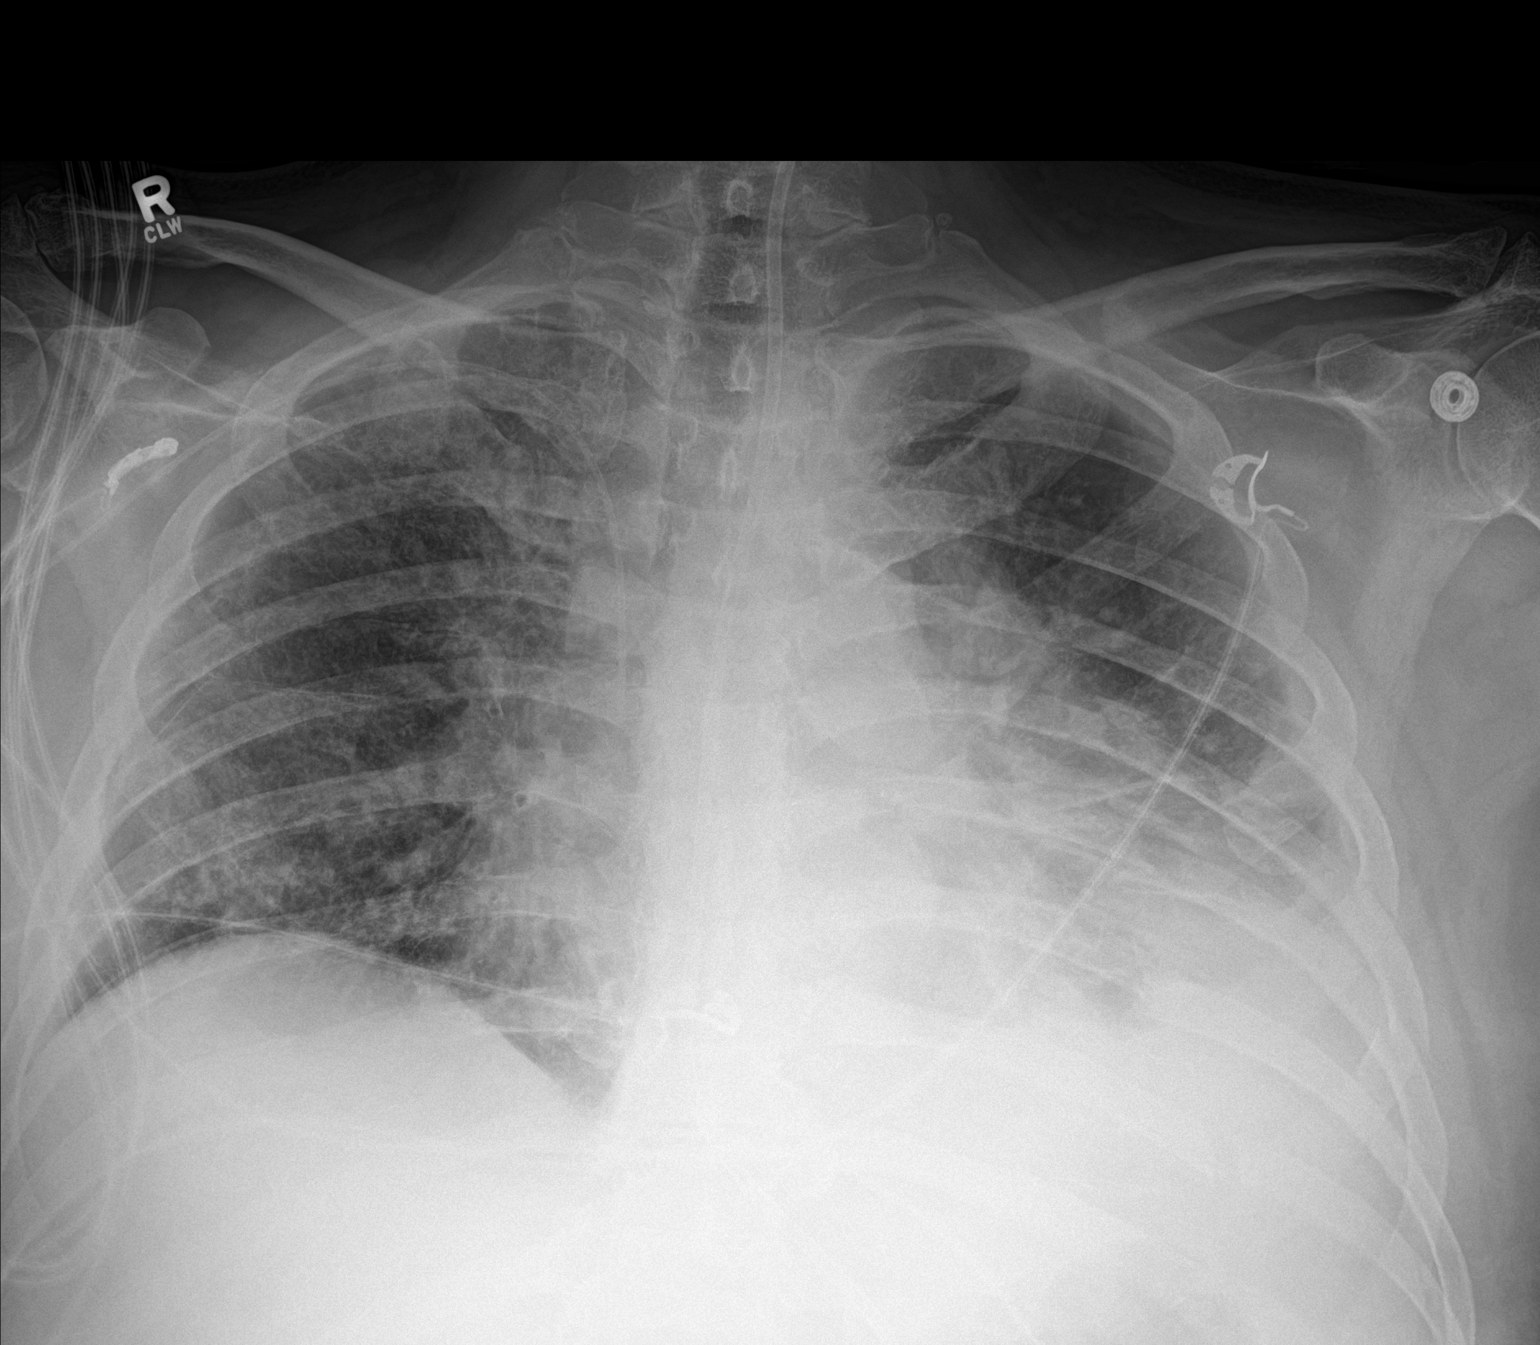

[1 of 1 positions shown; findings below may reference images not displayed]

FINDINGS: Right upper extremity PICC tip again overlies the central superior
vena cava. Enteric tube descends below the diaphragm with the tip
excluded by collimation.

Interval removal of left hemithorax pigtail catheter. No
pneumothorax is seen. Moderate left basilar heterogeneous airspace
opacification is similar to prior. Small left pleural effusion is
unchanged.

Cardiac silhouette again appears mildly enlarged.

No acute skeletal abnormality.
IMPRESSION: 1. Interval removal of small caliber left chest tube. No
pneumothorax is seen.
2. Small left pleural effusion and left basilar heterogeneous
airspace opacity is similar to prior.

## 2023-02-12 DEATH — deceased
